# Patient Record
Sex: Male | Born: 1935 | Race: White | Hispanic: No | Marital: Married | Smoking: Never smoker
Health system: Southern US, Community
[De-identification: ages and names within clinical notes are randomized; demographics above are authoritative.]

## PROBLEM LIST (undated history)

## (undated) DIAGNOSIS — I82409 Acute embolism and thrombosis of unspecified deep veins of unspecified lower extremity: Secondary | ICD-10-CM

## (undated) DIAGNOSIS — K219 Gastro-esophageal reflux disease without esophagitis: Secondary | ICD-10-CM

## (undated) DIAGNOSIS — Z972 Presence of dental prosthetic device (complete) (partial): Secondary | ICD-10-CM

## (undated) DIAGNOSIS — Z974 Presence of external hearing-aid: Secondary | ICD-10-CM

## (undated) HISTORY — PX: KNEE ARTHROSCOPY: SUR90

## (undated) HISTORY — PX: CHOLECYSTECTOMY: SHX55

## (undated) HISTORY — PX: APPENDECTOMY: SHX54

## (undated) HISTORY — PX: TOTAL HIP ARTHROPLASTY: SHX124

---

## 2004-08-05 HISTORY — PX: UPPER GI ENDOSCOPY: SHX6162

## 2004-08-05 HISTORY — PX: COLONOSCOPY: SHX174

## 2004-08-09 ENCOUNTER — Ambulatory Visit: Payer: Self-pay | Admitting: General Surgery

## 2005-01-10 ENCOUNTER — Ambulatory Visit: Payer: Self-pay | Admitting: General Surgery

## 2005-02-22 ENCOUNTER — Ambulatory Visit: Payer: Self-pay | Admitting: Internal Medicine

## 2005-04-01 ENCOUNTER — Ambulatory Visit: Payer: Self-pay | Admitting: General Surgery

## 2006-03-17 ENCOUNTER — Ambulatory Visit: Payer: Self-pay | Admitting: Urology

## 2008-04-27 ENCOUNTER — Ambulatory Visit: Payer: Self-pay | Admitting: Urology

## 2009-08-11 ENCOUNTER — Ambulatory Visit: Payer: Self-pay | Admitting: General Surgery

## 2010-07-05 ENCOUNTER — Ambulatory Visit: Payer: Self-pay

## 2010-07-23 ENCOUNTER — Ambulatory Visit: Payer: Self-pay | Admitting: Unknown Physician Specialty

## 2010-07-26 ENCOUNTER — Inpatient Hospital Stay: Payer: Self-pay | Admitting: Unknown Physician Specialty

## 2010-08-05 DIAGNOSIS — I82409 Acute embolism and thrombosis of unspecified deep veins of unspecified lower extremity: Secondary | ICD-10-CM

## 2010-08-05 HISTORY — DX: Acute embolism and thrombosis of unspecified deep veins of unspecified lower extremity: I82.409

## 2010-08-23 ENCOUNTER — Inpatient Hospital Stay: Payer: Self-pay | Admitting: Internal Medicine

## 2010-08-28 ENCOUNTER — Ambulatory Visit: Payer: Self-pay | Admitting: Oncology

## 2010-09-05 ENCOUNTER — Ambulatory Visit: Payer: Self-pay | Admitting: Oncology

## 2010-10-04 ENCOUNTER — Ambulatory Visit: Payer: Self-pay | Admitting: Oncology

## 2010-11-04 ENCOUNTER — Ambulatory Visit: Payer: Self-pay | Admitting: Oncology

## 2010-11-21 ENCOUNTER — Ambulatory Visit: Payer: Self-pay | Admitting: Surgery

## 2010-12-11 ENCOUNTER — Ambulatory Visit: Payer: Self-pay | Admitting: Surgery

## 2011-01-18 ENCOUNTER — Ambulatory Visit: Payer: Self-pay | Admitting: Unknown Physician Specialty

## 2011-02-05 ENCOUNTER — Ambulatory Visit: Payer: Self-pay | Admitting: Vascular Surgery

## 2011-03-01 ENCOUNTER — Ambulatory Visit: Payer: Self-pay | Admitting: Internal Medicine

## 2011-03-06 ENCOUNTER — Ambulatory Visit: Payer: Self-pay | Admitting: Internal Medicine

## 2011-03-28 ENCOUNTER — Ambulatory Visit: Payer: Self-pay | Admitting: Vascular Surgery

## 2011-04-01 ENCOUNTER — Ambulatory Visit: Payer: Self-pay | Admitting: Unknown Physician Specialty

## 2011-04-02 ENCOUNTER — Inpatient Hospital Stay: Payer: Self-pay | Admitting: Unknown Physician Specialty

## 2011-04-06 ENCOUNTER — Ambulatory Visit: Payer: Self-pay | Admitting: Internal Medicine

## 2011-04-10 ENCOUNTER — Ambulatory Visit: Payer: Self-pay | Admitting: Internal Medicine

## 2011-10-02 ENCOUNTER — Ambulatory Visit: Payer: Self-pay | Admitting: Vascular Surgery

## 2011-12-23 IMAGING — NM NM MULTISYS EXAM
3 series · 8 of 14 positions shown, 9 images · non-contrast
Comparison: none

REASON FOR EXAM: left hip pain status post hip replacement
COMMENTS:

[Series 4: 3d bone 1.25 b70s · axial · 0.98mm/px · z∈[+1078,+1274]mm · 3 of 562 slices shown, 4 images (1 of 3)]
[im 141/562  soft-tissue]
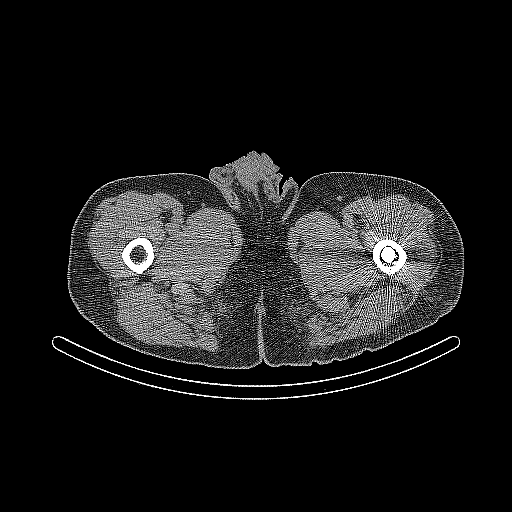
[im 141/562  bone]
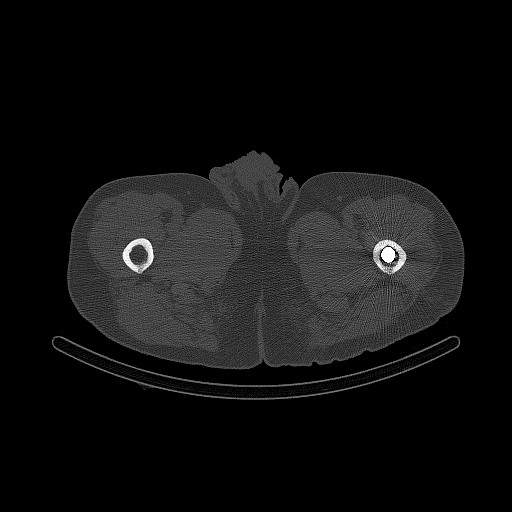
[im 281/562  bone]
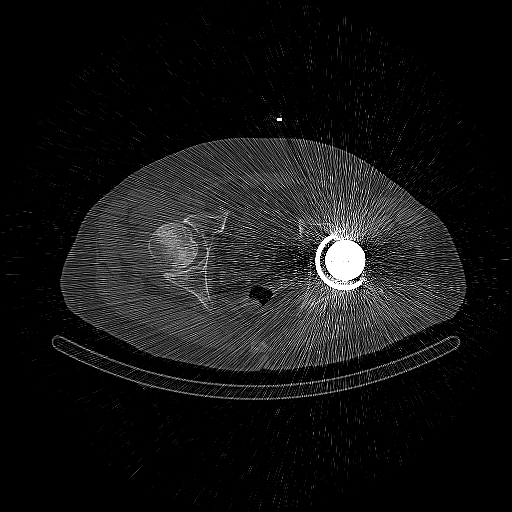
[im 421/562  bone]
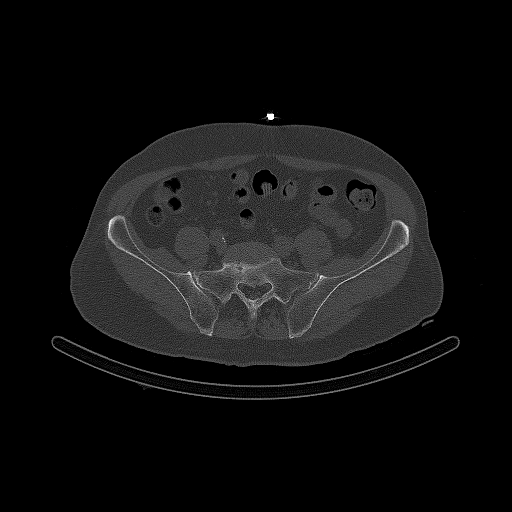

[Series 8: 3d bone 1.25 b70s · axial · 0.98mm/px · z∈[+1012,+1133]mm · 2 of 522 slices shown (2 of 3)]
[im 174/522  bone]
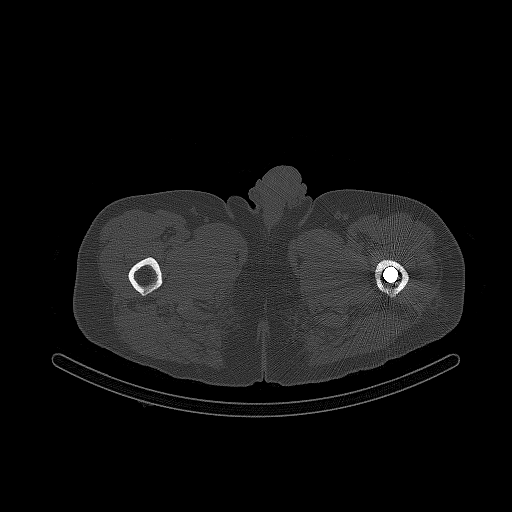
[im 348/522  bone]
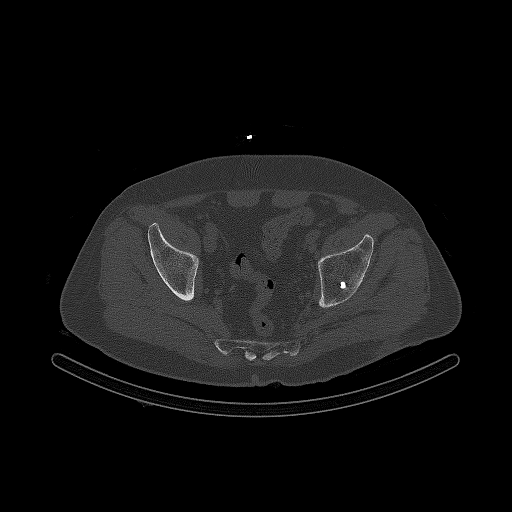

[Series 12: 3d bone 1.25 b70s · axial · 0.98mm/px · z∈[+1053,+1249]mm · 3 of 561 slices shown (3 of 3)]
[im 141/561  bone]
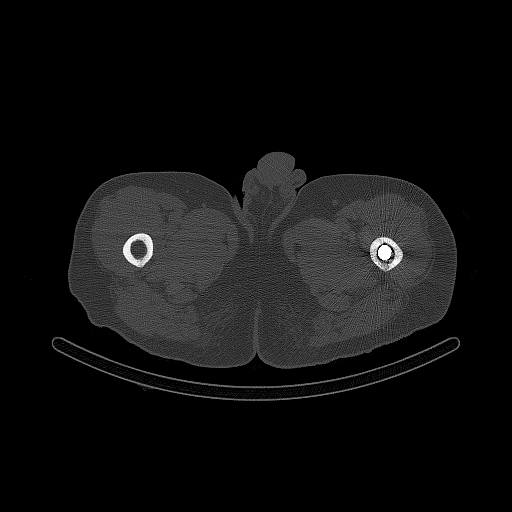
[im 281/561  bone]
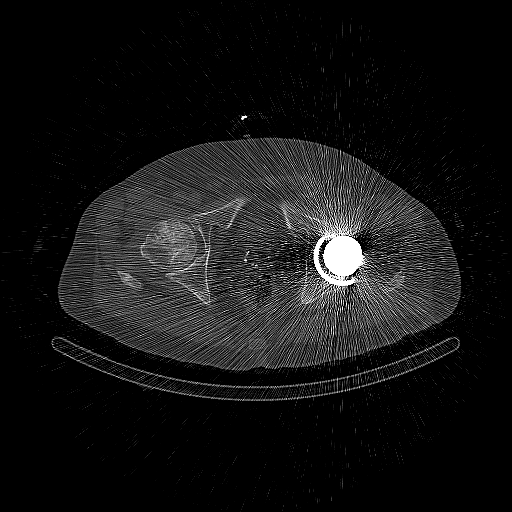
[im 421/561  bone]
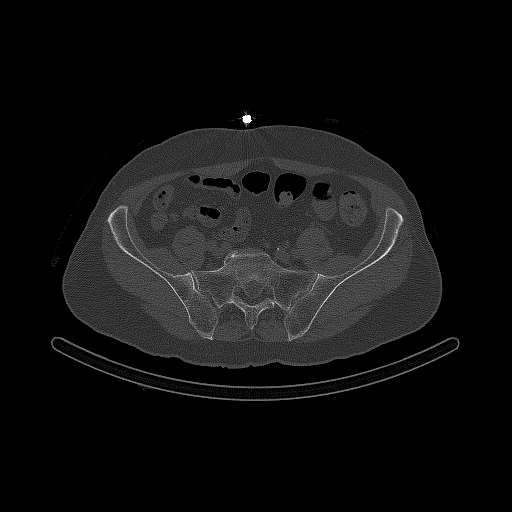

[8 of 14 positions shown; findings below may reference images not displayed]

PROCEDURE:     NM  - NM  IN - 111 WBC  LTD 48 HR   [DATE]  [DATE]

RESULT:     The patient underwent indium-888 imaging to evaluate the left
hip. Autologous labeled blood cells were injected. 395 uCi of indium-888 was
used to label the white blood cells. SPECT CT acquisition is performed. The
study shows no abnormal localization to suggest infection within the left
hip prosthesis. There is a somewhat mottled pattern of localization rather
than a fairly homogeneous pattern in the marrow on the delayed images which
could just be from decay of the tracer or binding. No persistent
reproducible abnormal tracer localization is seen within the soft tissue or
bony tissues on the 4-hour, 24-hour and 48-hour images. 72-hour image
acquisition was not performed because of limited tracer activity and the
poor quality of images at 48-hours.
IMPRESSION: No definite evidence of abnormal accumulation of labeled
white cells to suggest active infection.

## 2012-03-20 ENCOUNTER — Ambulatory Visit: Payer: Self-pay | Admitting: Internal Medicine

## 2012-03-20 LAB — CREATININE, SERUM
EGFR (African American): >60
EGFR (Non-African Amer.): >60

## 2013-01-25 DIAGNOSIS — N401 Enlarged prostate with lower urinary tract symptoms: Secondary | ICD-10-CM | POA: Insufficient documentation

## 2014-03-15 DIAGNOSIS — S83229A Peripheral tear of medial meniscus, current injury, unspecified knee, initial encounter: Secondary | ICD-10-CM | POA: Insufficient documentation

## 2014-03-21 ENCOUNTER — Ambulatory Visit: Payer: Self-pay | Admitting: General Practice

## 2014-05-18 ENCOUNTER — Ambulatory Visit: Payer: Self-pay | Admitting: General Practice

## 2014-05-24 ENCOUNTER — Ambulatory Visit: Payer: Self-pay | Admitting: Vascular Surgery

## 2014-05-25 ENCOUNTER — Ambulatory Visit: Payer: Self-pay | Admitting: General Practice

## 2014-10-04 ENCOUNTER — Ambulatory Visit: Payer: Self-pay | Admitting: Vascular Surgery

## 2014-11-14 ENCOUNTER — Ambulatory Visit: Payer: Self-pay | Admitting: Podiatry

## 2014-11-21 ENCOUNTER — Encounter: Payer: Self-pay | Admitting: Podiatry

## 2014-11-21 ENCOUNTER — Ambulatory Visit (INDEPENDENT_AMBULATORY_CARE_PROVIDER_SITE_OTHER): Payer: Medicare Other | Admitting: Podiatry

## 2014-11-21 VITALS — BP 128/73 | HR 70 | Resp 16 | Ht 69.0 in | Wt 174.0 lb

## 2014-11-21 DIAGNOSIS — M79673 Pain in unspecified foot: Secondary | ICD-10-CM

## 2014-11-21 DIAGNOSIS — B351 Tinea unguium: Secondary | ICD-10-CM | POA: Diagnosis not present

## 2014-11-21 NOTE — Progress Notes (Signed)
   Subjective:    Patient ID: Bill Cooper, male    DOB: 06/13/1930, 81 y.o.   MRN: [id number] Pt comes in today and wants to est care,  He would like his toe nails trimmed and cared for.  He would also like his toe nails looked at , he thinks that there is nail fungus on them .   HPI    Review of Systems  HENT: Positive for hearing loss.   All other systems reviewed and are negative.      Objective:   Physical ExamPresents today chief complaint of painful elongated toenails.  Objective: Pulses are palpable bilateral nails are thick, yellow dystrophic onychomycosis and painful palpation.         Assessment & Plan:  Assessment: Onychomycosis with pain in limb.  Plan: Treatment of nails in thickness and length as covered service secondary to pain.

## 2014-11-26 NOTE — Op Note (Signed)
PATIENT NAME:  Dallie DadHOYLE, Hamsa H MR#:  [id number] DATE OF BIRTH:  [birth year]  DATE OF PROCEDURE:  05/25/2014  PREOPERATIVE DIAGNOSIS: Internal derangement of the right knee.   POSTOPERATIVE DIAGNOSES: 1.  Tear of the posterior horn medial meniscus, right knee.  2.  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   PROCEDURE PERFORMED: Right knee arthroscopy, partial medial meniscectomy, and medial chondroplasty.   SURGEON: Illene LabradorJames P. Angie FavaHooten Jr., MD.    ANESTHESIA: General.   ESTIMATED BLOOD LOSS: Minimal.   TOURNIQUET TIME: Not used.   FLUIDS REPLACED: 700 mL of crystalloid.   INDICATIONS FOR SURGERY: The patient is an 81 year old male who has been seen for complaints of persistent right knee pain. MRI demonstrated findings consistent with meniscal pathology. After discussion of the risks and benefits of surgical intervention, the patient expressed understanding of the risks and benefits and agreed with plans for surgical intervention.   PROCEDURE IN DETAIL: The patient was brought into the Operating Room and, after adequate general anesthesia was achieved, a tourniquet was placed on the patient's right thigh and leg was placed in a leg holder. All bony prominences were well padded. The patient's right knee and leg were cleaned and prepped with alcohol and DuraPrep and draped in the usual sterile fashion. A "timeout" was performed as per usual protocol. The anticipated portal sites were injected with 0.25% Marcaine with epinephrine.   An anterolateral portal was created and a cannula was inserted. The scope was distended with fluid and the scope advanced down the medial gutter and into the medial compartment of the knee. Under visualization with the scope, an anteromedial portal was created and a hook probe was inserted. Inspection of the medial compartment demonstrated a complex degenerative tear of the posterior horn of the medial meniscus. This was freed using meniscal punches and a  4.5 mm shaver. Final contouring was performed using a 50-degree ArthroCare wand. The remaining rim of the meniscus was visualized and probed and felt to be stable. The anterior horn was visualized and probed and also felt to be stable. Inspection of the articular cartilage demonstrated grade 3 changes of chondromalacia primarily involving the medial femoral condyle. These areas were debrided and contoured using the 50-degree ArthroCare wand. The scope was advanced into the intracondylar region. The anterior cruciate ligament was visualized and probed and felt to be stable. The scope was removed from the anterolateral portal and reinserted via the anteromedial portal so as to better visualize the lateral compartment. The articular surface was in good condition. The lateral meniscus was visualized and probed and felt to be stable. Finally, the scope was positioned so as to visualize the patellofemoral articulation. Some mild chondral changes were noted. Good patellar tracking was noted. A chondroplasty was performed using the 50-degree ArthroCare wand.   The knee was irrigated with copious amounts of fluid and then suctioned dry. The anterolateral portal was reapproximated using #3-0 nylon. A combination of 0.25% Marcaine with epinephrine and 4 mg of morphine was injected via the scope. The scope was removed and the anteromedial portal was reapproximated using #3-0 nylon. A sterile dressing was applied followed by application of an ice wrap.   The patient tolerated the procedure well. He was transported to the recovery room in stable condition.    ____________________________ Illene LabradorJames P. Angie FavaHooten Jr., MD jph:at D: 05/25/2014 23:33:30 ET T: 05/26/2014 13:23:03 ET JOB#: [id number]  cc: Fayrene FearingJames P. Angie FavaHooten Jr., MD, <Dictator> Illene LabradorJAMES P Angie FavaHOOTEN JR MD ELECTRONICALLY SIGNED 05/26/2014 19:52

## 2014-11-26 NOTE — Op Note (Signed)
PATIENT NAME:  Bill Cooper, Bill Cooper MR#:  [id number] DATE OF BIRTH:  1930-05-04  DATE OF PROCEDURE:  05/24/2014  PREOPERATIVE DIAGNOSES:  1.  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  2.  Degenerative changes of the knee requiring surgery.   POSTOPERATIVE DIAGNOSES:  1.  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  2.  Degenerative changes of the knee requiring surgery.   PROCEDURES PERFORMED: 1.  Inferior venacavogram.  2.  Placement of infrarenal inferior vena caval filter.   PROCEDURE PERFORMED BY:  Renford DillsGregory G. Ailea Rhatigan, MD.   SEDATION:  Versed 3 mg plus fentanyl 100 mcg administered IV. Continuous ECG, pulse oximetry and cardiopulmonary monitoring were performed throughout the entire procedure by the interventional radiology nurse. Total sedation time was 30 minutes.   ACCESS: A 9 French sheath, right common femoral artery.   FLUOROSCOPY TIME: 0.7 minute.   CONTRAST USED: Isovue 15 mL.   INDICATIONS: [name] is an 81 year old gentleman who has had multiple episodes of deep venous thrombosis, all of which were following orthopedic surgery. He has required bilateral hip replacements as well as revision of the initial hip replacement. At this time now he needs knee surgery and has been sent for evaluation for IVC filter. Risks and benefits were reviewed. All questions answered. Clearly, given the risk of lethal pulmonary embolism and his repetitive history in the past the filter should be placed.  Again, removal was discussed with the patient and he is in agreement with this.   DESCRIPTION OF PROCEDURE: The patient is taken to special procedures and placed in supine position. After adequate sedation is achieved the right groin is prepped and draped in sterile fashion. Ultrasound is placed in a sterile sleeve. Common femoral artery is identified. It is echolucent and compressible indicating  patency. Image is recorded for the permanent record.   Under real-time visualization Seldinger needle is inserted, J-wire is advanced. A small incision is made in the skin and the dilator-sheath combination is passed over the wire. The sheath is then positioned with the marker dilator in the sheath at the confluence of the iliac veins. Bolus injection of contrast is then given and the vena cava is evaluated. After review of the images, the wire is reintroduced. The catheter is advanced up to the level of L2, and subsequently the Bard filter is deployed. This is the [place] filter.   The sheath is pulled, pressure is held, and there are no immediate complications.   INTERPRETATION: Inferior vena cava is opacified with a bolus injection of contrast. There is renal blush at L1. At L2 the cava measures approximately 18 mm in diameter. This is well within the parameters for the filter.   Filter is placed with excellent orientation in a vertical positioning.  SUMMARY: Successful placement of infrarenal inferior vena cava filter as described above.    ____________________________ Renford DillsGregory G. Davyn Elsasser, MD ggs:lt D: 05/25/2014 16:44:00 ET T: 05/26/2014 07:55:43 ET JOB#: [id number]  cc: Renford DillsGregory G. Nandika Stetzer, MD, <Dictator> Renford DillsGREGORY G Beverlie Kurihara MD ELECTRONICALLY SIGNED 06/08/2014 11:44

## 2014-12-04 NOTE — Op Note (Signed)
PATIENT NAME:  Dallie DadHOYLE, Turner H MR#:  [id number] DATE OF BIRTH:  [birth year]  DATE OF PROCEDURE:  10/04/2014  PREOPERATIVE DIAGNOSIS:  History of deep vein thrombosis and PE.   POSTOPERATIVE DIAGNOSIS:  History of deep vein thrombosis and PE.  PROCEDURE PERFORMED: Removal of inferior vena cava filter.   PROCEDURE PERFORMED BY: Renford DillsGregory G Ailsa Mireles, M.D.   SEDATION: Versed plus fentanyl.   CONTRAST USED: Isovue 15 mL.   FLUOROSCOPY TIME: 1.7 minutes.   INDICATIONS: The patient is an 81 year old gentleman who presents for removal of this filter. Risks and benefits are reviewed. The patient has agreed to proceed.   DESCRIPTION OF PROCEDURE: The patient is taken to special procedures and placed in the supine position. After adequate sedation is achieved the right neck is prepped and draped in sterile fashion. Ultrasound is placed in a sterile sleeve. Jugular vein is identified. Echolucent and compressible indicating patency. Images recorded and under real-time visualization, Seldinger needle is inserted. J-wire is advanced under fluoroscopic guidance, followed by the dilator and retrieval sheath. The sheath is positioned at the iliac confluence and AP projection of the vena cava is obtained. The filter is free of thrombus. No filling defects are noted, and therefore, the sheath is repositioned, snare is deployed. The filter is captured, collapsed and then withdrawn without difficulty. Light pressure is held, and there are no immediate complications.   INTERPRETATION: Successful retrieval of IVC filter as described above.    ____________________________ Renford DillsGregory G. Rastus Borton, MD ggs:mc D: 10/04/2014 13:56:42 ET T: 10/04/2014 15:35:36 ET JOB#: [id number]  cc: Renford DillsGregory G. Leauna Sharber, MD, <Dictator> Renford DillsGREGORY G Cashius Grandstaff MD ELECTRONICALLY SIGNED 10/11/2014 14:27

## 2014-12-22 ENCOUNTER — Encounter: Payer: Self-pay | Admitting: *Deleted

## 2014-12-27 NOTE — Discharge Instructions (Signed)

## 2014-12-28 ENCOUNTER — Encounter
Admission: RE | Disposition: A | Discharge status: Home / Self Care | Payer: Self-pay | Source: Ambulatory Visit | Attending: Ophthalmology

## 2014-12-28 ENCOUNTER — Ambulatory Visit: Payer: Medicare Other | Admitting: Anesthesiology

## 2014-12-28 ENCOUNTER — Ambulatory Visit
Admission: RE | Admit: 2014-12-28 | Discharge: 2014-12-28 | Disposition: A | Discharge status: Home / Self Care | Payer: Medicare Other | Source: Ambulatory Visit | Attending: Ophthalmology | Admitting: Ophthalmology

## 2014-12-28 ENCOUNTER — Encounter: Payer: Self-pay | Admitting: *Deleted

## 2014-12-28 DIAGNOSIS — M1990 Unspecified osteoarthritis, unspecified site: Secondary | ICD-10-CM | POA: Insufficient documentation

## 2014-12-28 DIAGNOSIS — Z96649 Presence of unspecified artificial hip joint: Secondary | ICD-10-CM | POA: Insufficient documentation

## 2014-12-28 DIAGNOSIS — Z79899 Other long term (current) drug therapy: Secondary | ICD-10-CM | POA: Insufficient documentation

## 2014-12-28 DIAGNOSIS — K219 Gastro-esophageal reflux disease without esophagitis: Secondary | ICD-10-CM | POA: Diagnosis not present

## 2014-12-28 DIAGNOSIS — Z86718 Personal history of other venous thrombosis and embolism: Secondary | ICD-10-CM | POA: Insufficient documentation

## 2014-12-28 DIAGNOSIS — H2511 Age-related nuclear cataract, right eye: Secondary | ICD-10-CM | POA: Diagnosis not present

## 2014-12-28 DIAGNOSIS — H9190 Unspecified hearing loss, unspecified ear: Secondary | ICD-10-CM | POA: Insufficient documentation

## 2014-12-28 HISTORY — DX: Presence of dental prosthetic device (complete) (partial): Z97.2

## 2014-12-28 HISTORY — PX: CATARACT EXTRACTION W/PHACO: SHX586

## 2014-12-28 HISTORY — DX: Gastro-esophageal reflux disease without esophagitis: K21.9

## 2014-12-28 HISTORY — DX: Presence of external hearing-aid: Z97.4

## 2014-12-28 HISTORY — DX: Acute embolism and thrombosis of unspecified deep veins of unspecified lower extremity: I82.409

## 2014-12-28 SURGERY — PHACOEMULSIFICATION, CATARACT, WITH IOL INSERTION
Anesthesia: Monitor Anesthesia Care | Laterality: Right | Wound class: Clean

## 2014-12-28 MED ORDER — EPINEPHRINE HCL 1 MG/ML IJ SOLN
INTRAMUSCULAR | Status: DC | PRN
  Administered 2014-12-28: 1 mg

## 2014-12-28 MED ORDER — ARMC OPHTHALMIC DILATING GEL
1.0000 "application " | OPHTHALMIC | Status: DC | PRN
  Administered 2014-12-28 (×2): 1 via OPHTHALMIC

## 2014-12-28 MED ORDER — FENTANYL CITRATE (PF) 100 MCG/2ML IJ SOLN
INTRAMUSCULAR | Status: DC | PRN
  Administered 2014-12-28: 50 ug via INTRAVENOUS

## 2014-12-28 MED ORDER — NA HYALUR & NA CHOND-NA HYALUR 0.4-0.35 ML IO KIT
PACK | INTRAOCULAR | Status: DC | PRN
  Administered 2014-12-28: 1 mL via INTRAOCULAR

## 2014-12-28 MED ORDER — CEFUROXIME OPHTHALMIC INJECTION 1 MG/0.1 ML
INJECTION | OPHTHALMIC | Status: DC | PRN
  Administered 2014-12-28: 0.1 mL via INTRACAMERAL

## 2014-12-28 MED ORDER — ACETAMINOPHEN 325 MG PO TABS: 325.0000 mg | ORAL_TABLET | ORAL | Status: DC | PRN

## 2014-12-28 MED ORDER — MIDAZOLAM HCL 2 MG/2ML IJ SOLN
INTRAMUSCULAR | Status: DC | PRN
  Administered 2014-12-28: 2 mg via INTRAVENOUS

## 2014-12-28 MED ORDER — ACETAMINOPHEN 160 MG/5ML PO SOLN: 325.0000 mg | ORAL | Status: DC | PRN

## 2014-12-28 MED ORDER — TETRACAINE HCL 0.5 % OP SOLN
1.0000 [drp] | OPHTHALMIC | Status: DC | PRN
  Administered 2014-12-28: 1 [drp] via OPHTHALMIC

## 2014-12-28 MED ORDER — TIMOLOL MALEATE 0.5 % OP SOLN
OPHTHALMIC | Status: DC | PRN
  Administered 2014-12-28: 1 [drp] via OPHTHALMIC

## 2014-12-28 MED ORDER — BRIMONIDINE TARTRATE 0.2 % OP SOLN
OPHTHALMIC | Status: DC | PRN
  Administered 2014-12-28: 1 [drp] via OPHTHALMIC

## 2014-12-28 MED ORDER — POVIDONE-IODINE 5 % OP SOLN
1.0000 "application " | Freq: Once | OPHTHALMIC | Status: AC
  Administered 2014-12-28: 1 via OPHTHALMIC

## 2014-12-28 SURGICAL SUPPLY — 25 items
CANNULA ANT/CHMB 27GA (MISCELLANEOUS) ×3 IMPLANT
GLOVE SURG LX 7.5 STRW (GLOVE) ×2
GLOVE SURG LX STRL 7.5 STRW (GLOVE) ×1 IMPLANT
GLOVE SURG TRIUMPH 8.0 PF LTX (GLOVE) ×3 IMPLANT
GOWN STRL REUS W/ TWL LRG LVL3 (GOWN DISPOSABLE) ×2 IMPLANT
GOWN STRL REUS W/TWL LRG LVL3 (GOWN DISPOSABLE) ×4
LENS IOL TECNIS 16.0 (Intraocular Lens) ×3 IMPLANT
LENS IOL TECNIS MONO 1P 16.0 (Intraocular Lens) ×1 IMPLANT
MARKER SKIN SURG W/RULER VIO (MISCELLANEOUS) ×3 IMPLANT
NDL RETROBULBAR .5 NSTRL (NEEDLE) IMPLANT
NEEDLE FILTER BLUNT 18X 1/2SAF (NEEDLE) ×2
NEEDLE FILTER BLUNT 18X1 1/2 (NEEDLE) ×1 IMPLANT
PACK CATARACT BRASINGTON (MISCELLANEOUS) ×3 IMPLANT
PACK EYE AFTER SURG (MISCELLANEOUS) ×3 IMPLANT
PACK OPTHALMIC (MISCELLANEOUS) ×3 IMPLANT
RING MALYGIN 7.0 (MISCELLANEOUS) IMPLANT
SUT ETHILON 10-0 CS-B-6CS-B-6 (SUTURE)
SUT VICRYL  9 0 (SUTURE)
SUT VICRYL 9 0 (SUTURE) IMPLANT
SUTURE EHLN 10-0 CS-B-6CS-B-6 (SUTURE) IMPLANT
SYR 3ML LL SCALE MARK (SYRINGE) ×3 IMPLANT
SYR 5ML LL (SYRINGE) IMPLANT
SYR TB 1ML LUER SLIP (SYRINGE) ×3 IMPLANT
WATER STERILE IRR 500ML POUR (IV SOLUTION) ×3 IMPLANT
WIPE NON LINTING 3.25X3.25 (MISCELLANEOUS) ×3 IMPLANT

## 2014-12-28 NOTE — H&P (Signed)
  The History and Physical notes were scanned in.  The patient remains stable and unchanged from the H&P.   Previous H&P reviewed, patient examined, and there are no changes.  Bill Cooper 12/28/2014 8:46 AM  

## 2014-12-28 NOTE — Transfer of Care (Signed)
Immediate Anesthesia Transfer of Care Note  Patient: Bill Cooper  Procedure(s) Performed: Procedure(s): CATARACT EXTRACTION PHACO AND INTRAOCULAR LENS PLACEMENT (IOC) (Right)  Patient Location: PACU  Anesthesia Type: MAC  Level of Consciousness: awake, alert  and patient cooperative  Airway and Oxygen Therapy: Patient Spontanous Breathing and Patient connected to supplemental oxygen  Post-op Assessment: Post-op Vital signs reviewed, Patient's Cardiovascular Status Stable, Respiratory Function Stable, Patent Airway and No signs of Nausea or vomiting  Post-op Vital Signs: Reviewed and stable  Complications: No apparent anesthesia complications

## 2014-12-28 NOTE — Anesthesia Postprocedure Evaluation (Signed)
  Anesthesia Post-op Note  Patient: Bill Cooper  Procedure(s) Performed: Procedure(s): CATARACT EXTRACTION PHACO AND INTRAOCULAR LENS PLACEMENT (IOC) (Right)  Anesthesia type:MAC  Patient location: PACU  Post pain: Pain level controlled  Post assessment: Post-op Vital signs reviewed, Patient's Cardiovascular Status Stable, Respiratory Function Stable, Patent Airway and No signs of Nausea or vomiting  Post vital signs: Reviewed and stable  Last Vitals:  Filed Vitals:   12/28/14 0945  BP: 110/65  Pulse: 63  Temp: 36.5 C  Resp: 16    Level of consciousness: awake, alert  and patient cooperative  Complications: No apparent anesthesia complications

## 2014-12-28 NOTE — Op Note (Signed)
LOCATION:  [facility]   PREOPERATIVE DIAGNOSIS:    Nuclear sclerotic cataract right eye. H25.11   POSTOPERATIVE DIAGNOSIS:  Nuclear sclerotic cataract right eye.     PROCEDURE:  Phacoemusification with posterior chamber intraocular lens placement of the right eye   LENS:   Implant Name Type Inv. Item Serial No. Manufacturer Lot No. LRB No. Used  LENS IMPL INTRAOC [id number] 16.0 - [id number] Intraocular Lens LENS IMPL INTRAOC [id number] 16.0 [id number]   Right 1        ULTRASOUND TIME: 14 % of 1 minutes, 24 seconds.  CDE 11.7   SURGEON:  Deirdre Evenerhadwick R. Haylei Cobin, MD   ANESTHESIA:  Topical with tetracaine drops and 2% Xylocaine jelly.   COMPLICATIONS:  None.   DESCRIPTION OF PROCEDURE:  The patient was identified in the holding room and transported to the operating room and placed in the supine position under the operating microscope.  The right eye was identified as the operative eye and it was prepped and draped in the usual sterile ophthalmic fashion.   A 1 millimeter clear-corneal paracentesis was made at the 12:00 position.  The anterior chamber was filled with Viscoat viscoelastic.  A 2.4 millimeter keratome was used to make a near-clear corneal incision at the 9:00 position.  A curvilinear capsulorrhexis was made with a cystotome and capsulorrhexis forceps.  Balanced salt solution was used to hydrodissect and hydrodelineate the nucleus.   Phacoemulsification was then used in stop and chop fashion to remove the lens nucleus and epinucleus.  The remaining cortex was then removed using the irrigation and aspiration handpiece. Provisc was then placed into the capsular bag to distend it for lens placement.  A lens was then injected into the capsular bag.  The remaining viscoelastic was aspirated.   Wounds were hydrated with balanced salt solution.  The anterior chamber was inflated to a physiologic pressure with balanced salt solution.  No wound leaks were noted. Cefuroxime 0.1 ml of a  10mg /ml solution was injected into the anterior chamber for a dose of 1 mg of intracameral antibiotic at the completion of the case.   Timolol and Brimonidine drops were applied to the eye.  The patient was taken to the recovery room in stable condition without complications of anesthesia or surgery.   Marium Ragan 12/28/2014, 9:44 AM

## 2014-12-28 NOTE — Anesthesia Preprocedure Evaluation (Signed)
Anesthesia Evaluation  Patient identified by MRN, date of birth, ID band Patient awake    Reviewed: Allergy & Precautions, H&P , NPO status , Patient's Chart, lab work & pertinent test results  Airway Mallampati: II  TM Distance: >3 FB Neck ROM: full    Dental  (+) Upper Dentures, Partial Lower   Pulmonary neg pulmonary ROS,    Pulmonary exam normal       Cardiovascular negative cardio ROS Normal cardiovascular exam    Neuro/Psych    GI/Hepatic Neg liver ROS, GERD-  Medicated,  Endo/Other  negative endocrine ROS  Renal/GU negative Renal ROS     Musculoskeletal   Abdominal   Peds  Hematology negative hematology ROS (+)   Anesthesia Other Findings   Reproductive/Obstetrics negative OB ROS                             Anesthesia Physical Anesthesia Plan  ASA: I  Anesthesia Plan: MAC   Post-op Pain Management:    Induction:   Airway Management Planned:   Additional Equipment:   Intra-op Plan:   Post-operative Plan:   Informed Consent: I have reviewed the patients History and Physical, chart, labs and discussed the procedure including the risks, benefits and alternatives for the proposed anesthesia with the patient or authorized representative who has indicated his/her understanding and acceptance.     Plan Discussed with: CRNA  Anesthesia Plan Comments:         Anesthesia Quick Evaluation

## 2014-12-29 ENCOUNTER — Encounter: Payer: Self-pay | Admitting: Ophthalmology

## 2015-10-11 DIAGNOSIS — Z86718 Personal history of other venous thrombosis and embolism: Secondary | ICD-10-CM | POA: Insufficient documentation

## 2016-05-06 ENCOUNTER — Emergency Department: Payer: Medicare Other

## 2016-05-06 ENCOUNTER — Inpatient Hospital Stay
Admission: EM | Admit: 2016-05-06 | Discharge: 2016-05-08 | DRG: 872 | Disposition: A | Discharge status: Home / Self Care | Payer: Medicare Other | Attending: Internal Medicine | Admitting: Internal Medicine

## 2016-05-06 ENCOUNTER — Encounter: Payer: Self-pay | Admitting: *Deleted

## 2016-05-06 DIAGNOSIS — E872 Acidosis: Secondary | ICD-10-CM | POA: Diagnosis present

## 2016-05-06 DIAGNOSIS — I959 Hypotension, unspecified: Secondary | ICD-10-CM | POA: Diagnosis present

## 2016-05-06 DIAGNOSIS — Z96643 Presence of artificial hip joint, bilateral: Secondary | ICD-10-CM | POA: Diagnosis present

## 2016-05-06 DIAGNOSIS — M6281 Muscle weakness (generalized): Secondary | ICD-10-CM

## 2016-05-06 DIAGNOSIS — K219 Gastro-esophageal reflux disease without esophagitis: Secondary | ICD-10-CM | POA: Diagnosis present

## 2016-05-06 DIAGNOSIS — B349 Viral infection, unspecified: Secondary | ICD-10-CM | POA: Diagnosis present

## 2016-05-06 DIAGNOSIS — R55 Syncope and collapse: Secondary | ICD-10-CM | POA: Diagnosis present

## 2016-05-06 DIAGNOSIS — R7301 Impaired fasting glucose: Secondary | ICD-10-CM | POA: Diagnosis present

## 2016-05-06 DIAGNOSIS — Z8249 Family history of ischemic heart disease and other diseases of the circulatory system: Secondary | ICD-10-CM

## 2016-05-06 DIAGNOSIS — E860 Dehydration: Secondary | ICD-10-CM | POA: Diagnosis present

## 2016-05-06 DIAGNOSIS — R509 Fever, unspecified: Secondary | ICD-10-CM | POA: Diagnosis present

## 2016-05-06 DIAGNOSIS — A419 Sepsis, unspecified organism: Secondary | ICD-10-CM | POA: Diagnosis present

## 2016-05-06 DIAGNOSIS — R197 Diarrhea, unspecified: Secondary | ICD-10-CM

## 2016-05-06 LAB — URINALYSIS COMPLETE WITH MICROSCOPIC (ARMC ONLY)
BACTERIA UA: NONE SEEN
Bilirubin Urine: NEGATIVE
GLUCOSE, UA: NEGATIVE mg/dL
HGB URINE DIPSTICK: NEGATIVE
NITRITE: NEGATIVE
Protein, ur: NEGATIVE mg/dL
SPECIFIC GRAVITY, URINE: 1.014 (ref 1.005–1.030)
Squamous Epithelial / LPF: NONE SEEN
pH: 5 (ref 5.0–8.0)

## 2016-05-06 LAB — COMPREHENSIVE METABOLIC PANEL
ALBUMIN: 3.8 g/dL (ref 3.5–5.0)
ALK PHOS: 72 U/L (ref 38–126)
ALT: 14 U/L — ABNORMAL LOW (ref 17–63)
ANION GAP: 10 (ref 5–15)
AST: 28 U/L (ref 15–41)
BUN: 14 mg/dL (ref 6–20)
CALCIUM: 9 mg/dL (ref 8.9–10.3)
CO2: 22 mmol/L (ref 22–32)
Chloride: 104 mmol/L (ref 101–111)
Creatinine, Ser: 0.95 mg/dL (ref 0.61–1.24)
GFR calc non Af Amer: >60 mL/min (ref >60)
GLUCOSE: 142 mg/dL — AB (ref 65–99)
POTASSIUM: 3.7 mmol/L (ref 3.5–5.1)
SODIUM: 136 mmol/L (ref 135–145)
TOTAL PROTEIN: 7 g/dL (ref 6.5–8.1)
Total Bilirubin: 1.3 mg/dL — ABNORMAL HIGH (ref 0.3–1.2)

## 2016-05-06 LAB — LACTIC ACID, PLASMA
LACTIC ACID, VENOUS: 1.5 mmol/L (ref 0.5–1.9)
Lactic Acid, Venous: 2.5 mmol/L (ref 0.5–1.9)
Lactic Acid, Venous: 1.2 mmol/L (ref 0.5–1.9)

## 2016-05-06 LAB — CBC WITH DIFFERENTIAL/PLATELET
BASOS PCT: 0 %
Basophils Absolute: 0 10*3/uL (ref 0–0.1)
EOS ABS: 0 10*3/uL (ref 0–0.7)
EOS PCT: 0 %
HCT: 43 % (ref 40.0–52.0)
Hemoglobin: 14.4 g/dL (ref 13.0–18.0)
LYMPHS ABS: 0.8 10*3/uL — AB (ref 1.0–3.6)
LYMPHS PCT: 7 %
MCH: 30.6 pg (ref 26.0–34.0)
MCHC: 33.5 g/dL (ref 32.0–36.0)
MCV: 91.4 fL (ref 80.0–100.0)
MONO ABS: 1 10*3/uL (ref 0.2–1.0)
MONOS PCT: 8 %
Neutro Abs: 10.7 10*3/uL — ABNORMAL HIGH (ref 1.4–6.5)
Neutrophils Relative %: 85 %
Platelets: 193 10*3/uL (ref 150–440)
RBC: 4.71 MIL/uL (ref 4.40–5.90)
RDW: 13.5 % (ref 11.5–14.5)
WBC: 12.7 10*3/uL — ABNORMAL HIGH (ref 3.8–10.6)

## 2016-05-06 LAB — TROPONIN I: Troponin I: <0.03 ng/mL (ref <0.03)

## 2016-05-06 LAB — LIPASE, BLOOD: Lipase: 25 U/L (ref 11–51)

## 2016-05-06 LAB — PROCALCITONIN: Procalcitonin: 0.87 ng/mL

## 2016-05-06 MED ORDER — SODIUM CHLORIDE 0.9 % IV SOLN
INTRAVENOUS | Status: DC
  Administered 2016-05-06 – 2016-05-08 (×4): via INTRAVENOUS

## 2016-05-06 MED ORDER — CEFTRIAXONE SODIUM 2 G IJ SOLR
2.0000 g | INTRAMUSCULAR | Status: DC
Start: 2016-05-06 — End: 2016-05-07
  Administered 2016-05-06: 2 g via INTRAVENOUS
  Filled 2016-05-06 (×2): qty 2

## 2016-05-06 MED ORDER — VANCOMYCIN HCL IN DEXTROSE 1-5 GM/200ML-% IV SOLN
1000.0000 mg | Freq: Once | INTRAVENOUS | Status: AC
  Administered 2016-05-06: 1000 mg via INTRAVENOUS

## 2016-05-06 MED ORDER — SODIUM CHLORIDE 0.9 % IV BOLUS (SEPSIS)
1000.0000 mL | Freq: Once | INTRAVENOUS | Status: AC
  Administered 2016-05-06: 1000 mL via INTRAVENOUS

## 2016-05-06 MED ORDER — ADULT MULTIVITAMIN W/MINERALS CH
1.0000 | ORAL_TABLET | Freq: Every day | ORAL | Status: DC
  Administered 2016-05-07 – 2016-05-08 (×2): 1 via ORAL
  Filled 2016-05-06 (×2): qty 1

## 2016-05-06 MED ORDER — ENOXAPARIN SODIUM 40 MG/0.4ML ~~LOC~~ SOLN
40.0000 mg | SUBCUTANEOUS | Status: DC
  Administered 2016-05-06 – 2016-05-07 (×2): 40 mg via SUBCUTANEOUS
  Filled 2016-05-06 (×2): qty 0.4

## 2016-05-06 MED ORDER — SODIUM CHLORIDE 0.9 % IV BOLUS (SEPSIS)
1328.0000 mL | Freq: Once | INTRAVENOUS | Status: AC
  Administered 2016-05-06: 1328 mL via INTRAVENOUS

## 2016-05-06 MED ORDER — VANCOMYCIN HCL IN DEXTROSE 1-5 GM/200ML-% IV SOLN
INTRAVENOUS | Status: AC
  Administered 2016-05-06: 1000 mg via INTRAVENOUS
  Filled 2016-05-06: qty 200

## 2016-05-06 MED ORDER — PIPERACILLIN-TAZOBACTAM 3.375 G IVPB 30 MIN
3.3750 g | Freq: Once | INTRAVENOUS | Status: AC
  Administered 2016-05-06: 3.375 g via INTRAVENOUS

## 2016-05-06 MED ORDER — PANTOPRAZOLE SODIUM 40 MG PO TBEC: 40.0000 mg | DELAYED_RELEASE_TABLET | Freq: Every day | ORAL | Status: DC | PRN

## 2016-05-06 MED ORDER — DOXYCYCLINE HYCLATE 100 MG PO TABS
100.0000 mg | ORAL_TABLET | Freq: Two times a day (BID) | ORAL | Status: DC
  Administered 2016-05-06 – 2016-05-08 (×4): 100 mg via ORAL
  Filled 2016-05-06 (×4): qty 1

## 2016-05-06 MED ORDER — PIPERACILLIN-TAZOBACTAM 3.375 G IVPB
INTRAVENOUS | Status: AC
  Administered 2016-05-06: 3.375 g via INTRAVENOUS
  Filled 2016-05-06: qty 50

## 2016-05-06 NOTE — Progress Notes (Signed)
At start of shift notified Charge RN that pt requires telemetry and no boxes available on unit.  Unit secretary calling other units for box but none available at this time.  Henriette CombsSarah Kordae Buonocore RN

## 2016-05-06 NOTE — ED Triage Notes (Signed)
Pt has had diarrhea for 2 days, pt temp 102 at home today, [facility], pt had syncopal episode on toilet after bowel movement, [facility], pt pale, alert and oriented

## 2016-05-06 NOTE — H&P (Signed)
[facility] - [place] at [facility]   PATIENT NAME: Bill Cooper    MR#:  [id number]  DATE OF BIRTH:  10/23/1929  DATE OF ADMISSION:  05/06/2016  PRIMARY CARE PHYSICIAN: Lynnea FerrierBERT J KLEIN III, MD   REQUESTING/REFERRING PHYSICIAN: Dr Raymon MuttonErika Gayle  CHIEF COMPLAINT:   Chief Complaint  Patient presents with  . Diarrhea  . Loss of Consciousness    HISTORY OF PRESENT ILLNESS:  Bill BrickBeacham Wiesman  is a 82 y.o. male and presents to the ER with a fever of 101.5 at home. He had a Riger this morning with shaking all over. They called Dr. Odessa FlemingKlein's office and was seen for an appointment. When they sent him over to get laboratory data he passed out. The patient complains of diarrhea for the past 3 or 4 mornings. He has diarrhea twice a day. No blood in the bowel movements. He's been feeling really weak. One month ago he had teeth cleaning but he had his prophylactic antibiotics prior. He felt weak a few weeks ago. The patient states he is out in the woods quite a bit. They picked off ticks about a month ago. Slight cough but no production.  PAST MEDICAL HISTORY:   Past Medical History:  Diagnosis Date  . DVT (deep venous thrombosis) (HCC) 2012   after hip replacement  . GERD (gastroesophageal reflux disease)    rare  . Wears dentures    full upper, partial lower  . Wears hearing aid    bilateral    PAST SURGICAL HISTORY:   Past Surgical History:  Procedure Laterality Date  . APPENDECTOMY    . CATARACT EXTRACTION W/PHACO Right 12/28/2014   Procedure: CATARACT EXTRACTION PHACO AND INTRAOCULAR LENS PLACEMENT (IOC);  Surgeon: Lockie Molahadwick Brasington, MD;  [facility];  Service: Ophthalmology;  Laterality: Right;  . CHOLECYSTECTOMY    . COLONOSCOPY    . KNEE ARTHROSCOPY    . TOTAL HIP ARTHROPLASTY Bilateral     SOCIAL HISTORY:   Social History  Substance Use Topics  . Smoking status: Never Smoker  . Smokeless tobacco: Never Used  . Alcohol use No     FAMILY HISTORY:   Family History  Problem Relation Age of Onset  . CAD Father     DRUG ALLERGIES:  No Known Allergies  REVIEW OF SYSTEMS:  CONSTITUTIONAL: Positive for fever, chills and sweats and Rigors. Positive for weakness EYES: No blurred or double vision. Wears glasses EARS, NOSE, AND THROAT: No tinnitus or ear pain. No sore throat. Wears hearing aid RESPIRATORY: Slight cough, no shortness of breath, wheezing or hemoptysis.  CARDIOVASCULAR: No chest pain, orthopnea, edema.  GASTROINTESTINAL: No nausea, vomiting, diarrhea or abdominal pain. No blood in bowel movements GENITOURINARY: No dysuria, hematuria.  ENDOCRINE: No polyuria, nocturia,  HEMATOLOGY: No anemia, easy bruising or bleeding SKIN: No rash or lesion. MUSCULOSKELETAL: Arthritis pain.  NEUROLOGIC: Syncope today PSYCHIATRY: No anxiety or depression.   MEDICATIONS AT HOME:   Prior to Admission medications   Medication Sig Start Date End Date Taking? Authorizing Provider  Multiple Vitamin (MULTIVITAMIN) capsule Take 1 capsule by mouth daily.   Yes Historical Provider, MD  omeprazole (PRILOSEC) 20 MG capsule Take 20 mg by mouth as needed.   Yes Historical Provider, MD      VITAL SIGNS:  Blood pressure (!) 116/54, pulse 75, temperature 98.8 F (37.1 C), temperature source Oral, resp. rate 20, height 5\' 8"  (1.727 m), weight 77.6 kg (171 lb), SpO2 95 %.  PHYSICAL EXAMINATION:  GENERAL:  82 y.o.-year-old patient lying in the bed with no acute distress.  EYES: Pupils equal, round, reactive to light and accommodation. No scleral icterus. Extraocular muscles intact.  HEENT: Head atraumatic, normocephalic. Oropharynx and nasopharynx clear.  NECK:  Supple, no jugular venous distention. No thyroid enlargement, no tenderness.  LUNGS: Normal breath sounds bilaterally, no wheezing, rales,rhonchi or crepitation. No use of accessory muscles of respiration.  CARDIOVASCULAR: S1, S2 normal. No murmurs, rubs, or gallops.   ABDOMEN: Soft, nontender, nondistended. Bowel sounds present. No organomegaly or mass.  EXTREMITIES: No pedal edema, cyanosis, or clubbing.  NEUROLOGIC: Cranial nerves II through XII are intact. Muscle strength 5/5 in all extremities. Sensation intact. Gait not checked.  PSYCHIATRIC: The patient is alert and oriented x 3.  SKIN: No rash, lesion, or ulcer.   LABORATORY PANEL:   CBC  Recent Labs Lab 05/06/16 1302  WBC 12.7*  HGB 14.4  HCT 43.0  PLT 193   ------------------------------------------------------------------------------------------------------------------  Chemistries   Recent Labs Lab 05/06/16 1302  NA 136  K 3.7  CL 104  CO2 22  GLUCOSE 142*  BUN 14  CREATININE 0.95  CALCIUM 9.0  AST 28  ALT 14*  ALKPHOS 72  BILITOT 1.3*   ------------------------------------------------------------------------------------------------------------------  Cardiac Enzymes  Recent Labs Lab 05/06/16 1302  TROPONINI <0.03   ------------------------------------------------------------------------------------------------------------------  RADIOLOGY:  Dg Chest Portable 1 View  Result Date: 05/06/2016 CLINICAL DATA:  Generalize weakness and fever EXAM: PORTABLE CHEST 1 VIEW COMPARISON:  None FINDINGS: The heart size and mediastinal contours are within normal limits. Both lungs are clear. The visualized skeletal structures are unremarkable. IMPRESSION: No active disease. Electronically Signed   By: Signa Kell M.D.   On: 05/06/2016 14:01     IMPRESSION AND PLAN:   1. Clinical sepsis with lactic acidosis, fever, leukocytosis and hypotension on presentation. I'm not quite sure what I'm treating at this point. Follow-up blood cultures just in case this is endocarditis from recent tooth cleaning. Send stool studies just in case this is infectious diarrhea. Repeat a chest x-ray after IV fluid hydration just in case this is pneumonia not seen on the initial chest x-ray.  Patient is out in the woods and has been bitten by ticks in the past. ER physician gave vancomycin and Zosyn. I will switch antibiotics to Rocephin and doxycycline which would cover tickborne issues, pneumonia and endocarditis. If stool studies come back positive would end up switching antibiotics at that time. Check an echocardiogram. Check a pro-calcitonin. 2. Hypotension and syncope give IV fluid hydration. Check an echocardiogram 3. GERD on when necessary Prilosec 4. Impaired fasting glucose. Check a hemoglobin A1c   All the records are reviewed and case discussed with ED provider. Management plans discussed with the patient, family and they are in agreement.  CODE STATUS: Full code  TOTAL TIME TAKING CARE OF THIS PATIENT: 55 minutes.    Alford Highland M.D on 05/06/2016 at 3:33 PM  Between 7am to 6pm - Pager - [phone]  After 6pm call admission pager [phone]  [facility] [employer] Office  [phone]  CC: Primary care physician; Lynnea Ferrier, MD

## 2016-05-06 NOTE — ED Provider Notes (Signed)
[facility] Emergency Department Provider Note   ____________________________________________   First MD Initiated Contact with Patient 05/06/16 1300     (approximate)  I have reviewed the triage vital signs and the nursing notes.   HISTORY  Chief Complaint Diarrhea and Loss of Consciousness    HPI Bill Cooper is a 82 y.o. male with GERD, no other chronic medical problems who presents for evaluation of generalized weakness and hypotension in the setting of diarrhea, ongoing for the past 3 days, gradual, constant, severe. Patient denies any blood in his stools. He denies any abdominal pain. He did develop a fever this morning. He was seen at Dr. Carmon Sails office where he developed hypotension and lightheadedness, he did not faint, fall or injure himself. He denies any chest pain. No coughing, vomiting, no pain or burning with urination.   Past Medical History:  Diagnosis Date  . DVT (deep venous thrombosis) (HCC) 2012   after hip replacement  . GERD (gastroesophageal reflux disease)    rare  . Wears dentures    full upper, partial lower  . Wears hearing aid    bilateral    There are no active problems to display for this patient.   Past Surgical History:  Procedure Laterality Date  . APPENDECTOMY    . CATARACT EXTRACTION W/PHACO Right 12/28/2014   Procedure: CATARACT EXTRACTION PHACO AND INTRAOCULAR LENS PLACEMENT (IOC);  Surgeon: Lockie Mola, MD;  Location: [facility];  Service: Ophthalmology;  Laterality: Right;  . CHOLECYSTECTOMY    . COLONOSCOPY    . KNEE ARTHROSCOPY    . TOTAL HIP ARTHROPLASTY Bilateral     Prior to Admission medications   Medication Sig Start Date End Date Taking? Authorizing Provider  Multiple Vitamin (MULTIVITAMIN) capsule Take 1 capsule by mouth daily.   Yes Historical Provider, MD  omeprazole (PRILOSEC) 20 MG capsule Take 20 mg by mouth as needed.   Yes Historical Provider, MD     Allergies Review of patient's allergies indicates no known allergies.  No family history on file.  Social History Social History  Substance Use Topics  . Smoking status: Never Smoker  . Smokeless tobacco: Never Used  . Alcohol use No    Review of Systems Constitutional: N+ fever/chills Eyes: No visual changes. ENT: No sore throat. Cardiovascular: Denies chest pain. Respiratory: Denies shortness of breath. Gastrointestinal: No abdominal pain.  No nausea, no vomiting.  + diarrhea.  No constipation. Genitourinary: Negative for dysuria. Musculoskeletal: Negative for back pain. Skin: Negative for rash. Neurological: Negative for headaches, focal weakness or numbness.  10-point ROS otherwise negative.  ____________________________________________   PHYSICAL EXAM:  Vitals:   05/06/16 1300 05/06/16 1304 05/06/16 1330  BP: 95/60 104/62 (!) 106/56  Pulse: 65 68 66  Resp:  18 18  Temp:  98.8 F (37.1 C)   TempSrc:  Oral   SpO2: 100% 99% 97%  Weight:  171 lb (77.6 kg)   Height:  5\' 8"  (1.727 m)     VITAL SIGNS: ED Triage Vitals  Enc Vitals Group     BP      Pulse      Resp      Temp      Temp src      SpO2      Weight      Height      Head Circumference      Peak Flow      Pain Score  Pain Loc      Pain Edu?      Excl. in GC?     Constitutional: Alert and oriented. Nontoxic appearing and in no acute distress. Eyes: Conjunctivae are normal. PERRL. EOMI. Head: Atraumatic. Nose: No congestion/rhinnorhea. Mouth/Throat: Mucous membranes are moist.  Oropharynx non-erythematous. Neck: No stridor.  Supple without meningismus. Cardiovascular: Normal rate, regular rhythm. Grossly normal heart sounds.  Good peripheral circulation. Respiratory: Normal respiratory effort.  No retractions. Lungs CTAB. Gastrointestinal: Soft and nontender. No distention. Normal bowel sounds. No CVA tenderness. Genitourinary: Deferred Musculoskeletal: No lower extremity  tenderness nor edema.  No joint effusions. Neurologic:  Normal speech and language. No gross focal neurologic deficits are appreciated. No gait instability. Skin:  Skin is warm, dry and intact. No rash noted. Psychiatric: Mood and affect are normal. Speech and behavior are normal.  ____________________________________________   LABS (all labs ordered are listed, but only abnormal results are displayed)  Labs Reviewed  CBC WITH DIFFERENTIAL/PLATELET - Abnormal; Notable for the following:       Result Value   WBC 12.7 (*)    Neutro Abs 10.7 (*)    Lymphs Abs 0.8 (*)    All other components within normal limits  COMPREHENSIVE METABOLIC PANEL - Abnormal; Notable for the following:    Glucose, Bld 142 (*)    ALT 14 (*)    Total Bilirubin 1.3 (*)    All other components within normal limits  URINALYSIS COMPLETEWITH MICROSCOPIC ([facility] ONLY) - Abnormal; Notable for the following:    Color, Urine YELLOW (*)    APPearance CLEAR (*)    Ketones, ur TRACE (*)    Leukocytes, UA TRACE (*)    All other components within normal limits  LACTIC ACID, PLASMA - Abnormal; Notable for the following:    Lactic Acid, Venous 2.5 (*)    All other components within normal limits  CULTURE, BLOOD (ROUTINE X 2)  CULTURE, BLOOD (ROUTINE X 2)  C DIFFICILE QUICK SCREEN W PCR REFLEX  GASTROINTESTINAL PANEL BY PCR, STOOL (REPLACES STOOL CULTURE)  LIPASE, BLOOD  TROPONIN I  LACTIC ACID, PLASMA  LACTIC ACID, PLASMA  LACTIC ACID, PLASMA   ____________________________________________  EKG  ED ECG REPORT I, Gayla DossGayle, Alexsys Eskin A, the attending physician, personally viewed and interpreted this ECG.   Date: 05/06/2016  EKG Time: 13:04  Rate: 68  Rhythm: normal sinus rhythm  Axis: normal  Intervals:none  ST&T Change: No acute ST elevation or acute ST depression. Borderline nonspecific T-wave abnormalities.  ____________________________________________  RADIOLOGY  CXR IMPRESSION:  No active disease.       ____________________________________________   PROCEDURES  Procedure(s) performed: None  Procedures  Critical Care performed: Yes, see critical care note(s)    CRITICAL CARE Performed by: Toney RakesGayle, Jackolyn Geron A   Total critical care time: 35 minutes  Critical care time was exclusive of separately billable procedures and treating other patients.  Critical care was necessary to treat or prevent imminent or life-threatening deterioration.  Critical care was time spent personally by me on the following activities: development of treatment plan with patient and/or surrogate as well as nursing, discussions with consultants, evaluation of patient's response to treatment, examination of patient, obtaining history from patient or surrogate, ordering and performing treatments and interventions, ordering and review of laboratory studies, ordering and review of radiographic studies, pulse oximetry and re-evaluation of patient's condition.  ____________________________________________   INITIAL IMPRESSION / ASSESSMENT AND PLAN / ED COURSE  Pertinent labs & imaging results that were available during  my care of the patient were reviewed by me and considered in my medical decision making (see chart for details).  AMARU BURROUGHS is a 82 y.o. male with GERD, no other chronic medical problems who presents for evaluation of generalized weakness and hypotension in the setting of diarrhea, ongoing for the past 3 days, gradual, constant, severe. On exam, he is nontoxic appearing in no acute distress, vital signs are notable for mild hypotension on arrival however that is improving with IV fluids. The remainder is 5 signs are stable and he is afebrile. He has a benign abdominal examination. Obtain screening labs, give IV fluids, obtain blood cultures, lactic acid, chest x-ray, urinalysis and reassess for disposition.  ----------------------------------------- 2:17 PM on  05/06/2016 -----------------------------------------  CODE STATUS is initiated secondary to lactic acid elevation as well as white blood cell count elevation/leukocytosis. Patient will receive 30 mL/kg bolus of normal saline, empiric vancomycin and Zosyn and I anticipate he will be admitted.  ----------------------------------------- 2:54 PM on 05/06/2016 ----------------------------------------- CXR clear, UA not consistent with infection. Patient continues to appear well, BP stable. Stool studies ordered. Case discussed with Dr. Hilton Sinclair for admission at this time.  Clinical Course     ____________________________________________   FINAL CLINICAL IMPRESSION(S) / ED DIAGNOSES  Final diagnoses:  Sepsis, due to unspecified organism (HCC)  Diarrhea, unspecified type      NEW MEDICATIONS STARTED DURING THIS VISIT:  New Prescriptions   No medications on file     Note:  This document was prepared using Dragon voice recognition software and may include unintentional dictation errors.    Gayla Doss, MD 05/06/16 1455

## 2016-05-06 NOTE — ED Notes (Signed)
Code  Sepsis  Called  To  Doug  At  [facility] 

## 2016-05-06 NOTE — ED Notes (Signed)
EKG was signed and exported into the system.

## 2016-05-07 ENCOUNTER — Inpatient Hospital Stay: Payer: Medicare Other

## 2016-05-07 ENCOUNTER — Inpatient Hospital Stay
Admit: 2016-05-07 | Discharge: 2016-05-07 | Disposition: A | Discharge status: Home / Self Care | Payer: Medicare Other | Attending: Internal Medicine | Admitting: Internal Medicine

## 2016-05-07 LAB — CBC
HEMATOCRIT: 37 % — AB (ref 40.0–52.0)
HEMOGLOBIN: 12.6 g/dL — AB (ref 13.0–18.0)
MCH: 31.4 pg (ref 26.0–34.0)
MCHC: 34 g/dL (ref 32.0–36.0)
MCV: 92.5 fL (ref 80.0–100.0)
Platelets: 156 10*3/uL (ref 150–440)
RBC: 4 MIL/uL — AB (ref 4.40–5.90)
RDW: 13.2 % (ref 11.5–14.5)
WBC: 6.8 10*3/uL (ref 3.8–10.6)

## 2016-05-07 LAB — BASIC METABOLIC PANEL
ANION GAP: 4 — AB (ref 5–15)
BUN: 12 mg/dL (ref 6–20)
CHLORIDE: 112 mmol/L — AB (ref 101–111)
CO2: 24 mmol/L (ref 22–32)
Calcium: 7.7 mg/dL — ABNORMAL LOW (ref 8.9–10.3)
Creatinine, Ser: 0.8 mg/dL (ref 0.61–1.24)
GFR calc Af Amer: >60 mL/min (ref >60)
GFR calc non Af Amer: >60 mL/min (ref >60)
Glucose, Bld: 98 mg/dL (ref 65–99)
POTASSIUM: 3.7 mmol/L (ref 3.5–5.1)
SODIUM: 140 mmol/L (ref 135–145)

## 2016-05-07 LAB — C DIFFICILE QUICK SCREEN W PCR REFLEX
C DIFFICLE (CDIFF) ANTIGEN: NEGATIVE
C Diff interpretation: NOT DETECTED
C Diff toxin: NEGATIVE

## 2016-05-07 LAB — GASTROINTESTINAL PANEL BY PCR, STOOL (REPLACES STOOL CULTURE)
ASTROVIRUS: NOT DETECTED
Adenovirus F40/41: NOT DETECTED
CAMPYLOBACTER SPECIES: NOT DETECTED
CYCLOSPORA CAYETANENSIS: NOT DETECTED
Cryptosporidium: NOT DETECTED
ENTAMOEBA HISTOLYTICA: NOT DETECTED
ENTEROTOXIGENIC E COLI (ETEC): NOT DETECTED
Enteroaggregative E coli (EAEC): NOT DETECTED
Enteropathogenic E coli (EPEC): NOT DETECTED
Giardia lamblia: NOT DETECTED
Norovirus GI/GII: NOT DETECTED
PLESIMONAS SHIGELLOIDES: NOT DETECTED
Rotavirus A: NOT DETECTED
SALMONELLA SPECIES: NOT DETECTED
SAPOVIRUS (I, II, IV, AND V): NOT DETECTED
SHIGA LIKE TOXIN PRODUCING E COLI (STEC): NOT DETECTED
SHIGELLA/ENTEROINVASIVE E COLI (EIEC): NOT DETECTED
VIBRIO CHOLERAE: NOT DETECTED
VIBRIO SPECIES: NOT DETECTED
Yersinia enterocolitica: NOT DETECTED

## 2016-05-07 LAB — ROCKY MTN SPOTTED FVR ABS PNL(IGG+IGM)
RMSF IGG: NEGATIVE
RMSF IGM: 1.34 {index} — AB (ref 0.00–0.89)

## 2016-05-07 MED ORDER — DEXTROSE 5 % IV SOLN
2.0000 g | INTRAVENOUS | Status: DC
  Administered 2016-05-07: 2 g via INTRAVENOUS
  Filled 2016-05-07 (×2): qty 2

## 2016-05-07 MED ORDER — ACETAMINOPHEN 325 MG PO TABS
650.0000 mg | ORAL_TABLET | Freq: Four times a day (QID) | ORAL | Status: DC | PRN
  Filled 2016-05-07: qty 2

## 2016-05-07 NOTE — Progress Notes (Signed)
[facility] - [place] at [facility]   PATIENT NAME: Bill Cooper    MR#:  [id number]  DATE OF BIRTH:  06/21/1930  SUBJECTIVE:  CHIEF COMPLAINT:   Chief Complaint  Patient presents with  . Diarrhea  . Loss of Consciousness   Weakness. No BM.  REVIEW OF SYSTEMS:  Review of Systems  Constitutional: Negative for chills and fever.  HENT: Negative for sore throat.   Eyes: Negative for blurred vision and double vision.  Respiratory: Negative for cough, shortness of breath, wheezing and stridor.   Cardiovascular: Negative for chest pain and leg swelling.  Gastrointestinal: Negative for blood in stool, diarrhea, melena, nausea and vomiting.  Genitourinary: Negative for dysuria and urgency.  Musculoskeletal: Negative for joint pain.  Skin: Negative for itching and rash.  Neurological: Negative for dizziness, seizures, loss of consciousness and headaches.  Psychiatric/Behavioral: Negative for depression. The patient is not nervous/anxious.     DRUG ALLERGIES:  No Known Allergies VITALS:  Blood pressure (!) 108/56, pulse 62, temperature 97.5 F (36.4 C), temperature source Oral, resp. rate 16, height 5\' 8"  (1.727 m), weight 171 lb (77.6 kg), SpO2 95 %. PHYSICAL EXAMINATION:  Physical Exam  Constitutional: He is oriented to person, place, and time and well-developed, well-nourished, and in no distress. No distress.  HENT:  Head: Normocephalic.  Mouth/Throat: Oropharynx is clear and moist.  Eyes: Conjunctivae and EOM are normal. No scleral icterus.  Neck: Normal range of motion. Neck supple. No JVD present.  Cardiovascular: Normal rate, regular rhythm and normal heart sounds.  Exam reveals no gallop.   No murmur heard. Pulmonary/Chest: Effort normal and breath sounds normal. No respiratory distress. He has no wheezes.  Abdominal: Soft. Bowel sounds are normal. He exhibits no distension. There is no tenderness.  Musculoskeletal: Normal range of motion. He exhibits  no edema or tenderness.  Lymphadenopathy:    He has no cervical adenopathy.  Neurological: He is alert and oriented to person, place, and time. No cranial nerve deficit.  Skin: Skin is warm.  Psychiatric: Affect and judgment normal.   LABORATORY PANEL:   CBC  Recent Labs Lab 05/07/16 0539  WBC 6.8  HGB 12.6*  HCT 37.0*  PLT 156   ------------------------------------------------------------------------------------------------------------------ Chemistries   Recent Labs Lab 05/06/16 1302 05/07/16 0539  NA 136 140  K 3.7 3.7  CL 104 112*  CO2 22 24  GLUCOSE 142* 98  BUN 14 12  CREATININE 0.95 0.80  CALCIUM 9.0 7.7*  AST 28  --   ALT 14*  --   ALKPHOS 72  --   BILITOT 1.3*  --    RADIOLOGY:  Dg Chest 2 View  Result Date: 05/07/2016 CLINICAL DATA:  Fever EXAM: CHEST  2 VIEW COMPARISON:  Portable chest x-ray of May 06, 2016 FINDINGS: The lungs are mildly hypoinflated. There is no focal infiltrate. There is no pleural effusion. The heart and pulmonary vascularity are normal. The mediastinum is normal in width. There is calcification in the wall of the aortic arch. There is no pleural effusion. The observed bony thorax is unremarkable. IMPRESSION: There is no active cardiopulmonary disease. Electronically Signed   By: David  SwazilandJordan M.D.   On: 05/07/2016 07:45   ASSESSMENT AND PLAN:   1. Clinical sepsis with lactic acidosis, fever, leukocytosis and hypotension on presentation.  On Rocephin and doxycycline. Follow-up blood cultures and stool studies. No C diff.  Leukocytosis improved. F/u echocardiogram. Check a pro-calcitonin. 2. Hypotension and syncope, improved with IV fluid  hydration. F/u echocardiogram 3. GERD on when necessary Prilosec 4. Impaired fasting glucose. Check a hemoglobin A1c Diarrhea. No BM after admission. Lactic acidosis. Improved.  All the records are reviewed and case discussed with Care Management/Social Worker. Management plans discussed with  the patient, his wife and they are in agreement.  CODE STATUS: full code.  TOTAL TIME TAKING CARE OF THIS PATIENT: 33 minutes.   More than 50% of the time was spent in counseling/coordination of care: YES  POSSIBLE D/C IN 1-2 DAYS, DEPENDING ON CLINICAL CONDITION.   Shaune Pollack M.D on 05/07/2016 at 2:24 PM  Between 7am to 6pm - Pager - [phone]  After 6pm go to [email or web address] - Social research officer, government  [facility] [place] [facility]  Office  [phone]  CC: Primary care physician; Lynnea Ferrier, MD  Note: This dictation was prepared with Dragon dictation along with smaller phrase technology. Any transcriptional errors that result from this process are unintentional.

## 2016-05-07 NOTE — Care Management Important Message (Signed)
Important Message  Patient Details  Name: Dallie DadBeacham H Rolin MRN: [id number] Date of Birth: 02/26/1930   Medicare Important Message Given:  Yes    Gwenette GreetBrenda S Jocelin Schuelke, RN 05/07/2016, 11:22 AM

## 2016-05-07 NOTE — Evaluation (Signed)
Physical Therapy Evaluation Patient Details Name: TREYVONE CHELF MRN: [id number] DOB: May 06, 1930 Today's Date: 05/07/2016   History of Present Illness  82 y/o male here with weakness, diarrhea and ultimately admitted with sepsis.    Clinical Impression  Pt did well with PT exam and generally showed good confidence with mobility and ambulation.  He is a little weaker than his baseline but is safe to return home does not need further PT intervention.  Pt able to ambulate easily and with good speed around the nurses' station and showed no balance or other safety concerns.     Follow Up Recommendations No PT follow up    Equipment Recommendations       Recommendations for Other Services       Precautions / Restrictions Restrictions Weight Bearing Restrictions: No      Mobility  Bed Mobility Overal bed mobility: Independent             General bed mobility comments: Pt is able to rise to EOB w/o assist and shows good confidence and safety  Transfers Overall transfer level: Independent Equipment used: None             General transfer comment: Pt is able to rise to standing without AD and shows good confidence and safety.  Ambulation/Gait Ambulation/Gait assistance: Independent Ambulation Distance (Feet): 250 Feet Assistive device: None       General Gait Details: Pt initially with some hesitancy, but ultimately did well with ambulation showing appropriate speed, good confidence and no safety issues.  He has very little fatigue with the effort and ultimately did well.   Stairs            Wheelchair Mobility    Modified Rankin (Stroke Patients Only)       Balance Overall balance assessment: No apparent balance deficits (not formally assessed)                                           Pertinent Vitals/Pain Pain Assessment: No/denies pain    Home Living Family/patient expects to be discharged to:: Private residence Living  Arrangements: Spouse/significant other   Type of Home: House Home Access: Stairs to enter Entrance Stairs-Rails:  (yes) Entrance Stairs-Number of Steps: 3   Home Equipment: Walker - 2 wheels;Cane - single point      Prior Function Level of Independence: Independent         Comments: Pt able to be very active with gardening, working outside, driving, etc.     Higher education careers adviser        Extremity/Trunk Assessment   Upper Extremity Assessment: Overall WFL for tasks assessed           Lower Extremity Assessment: Overall WFL for tasks assessed         Communication   Communication: No difficulties  Cognition Arousal/Alertness: Awake/alert Behavior During Therapy: WFL for tasks assessed/performed Overall Cognitive Status: Within Functional Limits for tasks assessed                      General Comments      Exercises     Assessment/Plan    PT Assessment Patent does not need any further PT services  PT Problem List            PT Treatment Interventions      PT Goals (Current goals can be  found in the Care Plan section)  Acute Rehab PT Goals Patient Stated Goal: go home and take care of my garden    Frequency     Barriers to discharge        Co-evaluation               End of Session Equipment Utilized During Treatment: Gait belt Activity Tolerance: Patient tolerated treatment well Patient left: with call bell/phone within reach;with chair alarm set;with family/visitor present Nurse Communication: Mobility status         Time: [id number] PT Time Calculation (min) (ACUTE ONLY): 18 min   Charges:   PT Evaluation $PT Eval Low Complexity: 1 Procedure     PT G CodesMalachi Pro:        Haya Hemler R Khadeejah Castner , DPT 05/07/2016, 10:11 AM

## 2016-05-07 NOTE — Progress Notes (Signed)
*  PRELIMINARY RESULTS* Echocardiogram 2D Echocardiogram has been performed.  Cristela BlueHege, Cortny Bambach 05/07/2016, 3:23 PM

## 2016-05-08 LAB — ECHOCARDIOGRAM COMPLETE
Height: 68 in
Weight: 2736 oz

## 2016-05-08 LAB — PROCALCITONIN: PROCALCITONIN: 0.45 ng/mL

## 2016-05-08 MED ORDER — DOXYCYCLINE HYCLATE 100 MG PO TABS: 100.0000 mg | ORAL_TABLET | Freq: Two times a day (BID) | ORAL | 0 refills | Status: DC

## 2016-05-08 NOTE — Progress Notes (Signed)
Pt being discharged home, discharge instructions and prescriptions reviewed with pt and wife, states understanding, pt with no complaints, no distress or discomfort noted

## 2016-05-08 NOTE — Discharge Summary (Signed)
[facility] [place] [facility]   PATIENT NAME: Bill Cooper    MR#:  [id number]  DATE OF BIRTH:  1930-01-14  DATE OF ADMISSION:  05/06/2016 ADMITTING PHYSICIAN: Alford Highland, MD  DATE OF DISCHARGE: 05/08/2016  9:20 AM  PRIMARY CARE PHYSICIAN: Curtis Sites III, MD    ADMISSION DIAGNOSIS:  Fever [R50.9] Sepsis, due to unspecified organism (HCC) [A41.9] Diarrhea, unspecified type [R19.7]  DISCHARGE DIAGNOSIS:  Active Problems:   Sepsis (HCC)   SECONDARY DIAGNOSIS:   Past Medical History:  Diagnosis Date  . DVT (deep venous thrombosis) (HCC) 2012   after hip replacement  . GERD (gastroesophageal reflux disease)    rare  . Wears dentures    full upper, partial lower  . Wears hearing aid    bilateral    HOSPITAL COURSE:   1. Patient was admitted with clinical sepsis but no real clear cause. Both cultures were negative. Repeat chest x-ray was negative. Patient was given vancomycin and Zosyn by ER physician. I switched her to Rocephin and doxycycline. The patient had no further temperature. I sent him home on a course of doxycycline just in case this was tickborne illness. Could also be a viral illness. 2. Syncope secondary to dehydration and relative hypotension. Patient was advised to cut back on soda intake and drink water instead. 3. Diarrhea stool for C. difficile and gastrointestinal panel was negative. 4. GERD on PPI  DISCHARGE CONDITIONS:   Satisfactory  CONSULTS OBTAINED:   none  DRUG ALLERGIES:  No Known Allergies  DISCHARGE MEDICATIONS:   Discharge Medication List as of 05/08/2016  8:46 AM    START taking these medications   Details  doxycycline (VIBRA-TABS) 100 MG tablet Take 1 tablet (100 mg total) by mouth every 12 (twelve) hours., Starting Wed 05/08/2016, Print      CONTINUE these medications which have NOT CHANGED   Details  Multiple Vitamin (MULTIVITAMIN) capsule Take 1 capsule by mouth daily., Historical Med     omeprazole (PRILOSEC) 20 MG capsule Take 20 mg by mouth as needed., Historical Med         DISCHARGE INSTRUCTIONS:    Follow-up PMD one week  If you experience worsening of your admission symptoms, develop shortness of breath, life threatening emergency, suicidal or homicidal thoughts you must seek medical attention immediately by calling 911 or calling your MD immediately  if symptoms less severe.  You Must read complete instructions/literature along with all the possible adverse reactions/side effects for all the Medicines you take and that have been prescribed to you. Take any new Medicines after you have completely understood and accept all the possible adverse reactions/side effects.   Please note  You were cared for by a hospitalist during your hospital stay. If you have any questions about your discharge medications or the care you received while you were in the hospital after you are discharged, you can call the unit and asked to speak with the hospitalist on call if the hospitalist that took care of you is not available. Once you are discharged, your primary care physician will handle any further medical issues. Please note that NO REFILLS for any discharge medications will be authorized once you are discharged, as it is imperative that you return to your primary care physician (or establish a relationship with a primary care physician if you do not have one) for your aftercare needs so that they can reassess your need for medications and monitor your lab values.  Today   CHIEF COMPLAINT:   Chief Complaint  Patient presents with  . Diarrhea  . Loss of Consciousness    HISTORY OF PRESENT ILLNESS:  Bill Cooper  is a 82 y.o. male presented with diarrhea fever and syncope   VITAL SIGNS:  Blood pressure (!) 116/53, pulse 64, temperature 98.4 F (36.9 C), temperature source Oral, resp. rate (!) 23, height 5\' 8"  (1.727 m), weight 77.6 kg (171 lb), SpO2 95 %. Respiratory  rate was 16 on my count on discharge   PHYSICAL EXAMINATION:  GENERAL:  82 y.o.-year-old patient lying in the bed with no acute distress.  EYES: Pupils equal, round, reactive to light and accommodation. No scleral icterus. Extraocular muscles intact.  HEENT: Head atraumatic, normocephalic. Oropharynx and nasopharynx clear.  NECK:  Supple, no jugular venous distention. No thyroid enlargement, no tenderness.  LUNGS: Normal breath sounds bilaterally, no wheezing, rales,rhonchi or crepitation. No use of accessory muscles of respiration.  CARDIOVASCULAR: S1, S2 normal. No murmurs, rubs, or gallops.  ABDOMEN: Soft, non-tender, non-distended. Bowel sounds present. No organomegaly or mass.  EXTREMITIES: No pedal edema, cyanosis, or clubbing.  NEUROLOGIC: Cranial nerves II through XII are intact. Muscle strength 5/5 in all extremities. Sensation intact. Gait not checked.  PSYCHIATRIC: The patient is alert and oriented x 3.  SKIN: No obvious rash, lesion, or ulcer.   DATA REVIEW:   CBC  Recent Labs Lab 05/07/16 0539  WBC 6.8  HGB 12.6*  HCT 37.0*  PLT 156    Chemistries   Recent Labs Lab 05/06/16 1302 05/07/16 0539  NA 136 140  K 3.7 3.7  CL 104 112*  CO2 22 24  GLUCOSE 142* 98  BUN 14 12  CREATININE 0.95 0.80  CALCIUM 9.0 7.7*  AST 28  --   ALT 14*  --   ALKPHOS 72  --   BILITOT 1.3*  --     Cardiac Enzymes  Recent Labs Lab 05/06/16 1302  TROPONINI <0.03    Microbiology Results  Results for orders placed or performed during the hospital encounter of 05/06/16  Blood culture (routine x 2)     Status: None (Preliminary result)   Collection Time: 05/06/16  1:03 PM  Result Value Ref Range Status   Specimen Description BLOOD RIGHT AC  Final   Special Requests   Final    BOTTLES DRAWN AEROBIC AND ANAEROBIC AER 7ML ANA 9ML   Culture NO GROWTH 2 DAYS  Final   Report Status PENDING  Incomplete  Blood culture (routine x 2)     Status: None (Preliminary result)    Collection Time: 05/06/16  1:03 PM  Result Value Ref Range Status   Specimen Description BLOOD LEFT AC  Final   Special Requests   Final    BOTTLES DRAWN AEROBIC AND ANAEROBIC AER 9ML ANA 8ML   Culture NO GROWTH 2 DAYS  Final   Report Status PENDING  Incomplete  C difficile quick scan w PCR reflex     Status: None   Collection Time: 05/07/16  9:02 AM  Result Value Ref Range Status   C Diff antigen NEGATIVE NEGATIVE Final   C Diff toxin NEGATIVE NEGATIVE Final   C Diff interpretation No C. difficile detected.  Final  Gastrointestinal Panel by PCR , Stool     Status: None   Collection Time: 05/07/16  9:02 AM  Result Value Ref Range Status   Campylobacter species NOT DETECTED NOT DETECTED Final   Plesimonas shigelloides NOT DETECTED NOT  DETECTED Final   Salmonella species NOT DETECTED NOT DETECTED Final   Yersinia enterocolitica NOT DETECTED NOT DETECTED Final   Vibrio species NOT DETECTED NOT DETECTED Final   Vibrio cholerae NOT DETECTED NOT DETECTED Final   Enteroaggregative E coli (EAEC) NOT DETECTED NOT DETECTED Final   Enteropathogenic E coli (EPEC) NOT DETECTED NOT DETECTED Final   Enterotoxigenic E coli (ETEC) NOT DETECTED NOT DETECTED Final   Shiga like toxin producing E coli (STEC) NOT DETECTED NOT DETECTED Final   Shigella/Enteroinvasive E coli (EIEC) NOT DETECTED NOT DETECTED Final   Cryptosporidium NOT DETECTED NOT DETECTED Final   Cyclospora cayetanensis NOT DETECTED NOT DETECTED Final   Entamoeba histolytica NOT DETECTED NOT DETECTED Final   Giardia lamblia NOT DETECTED NOT DETECTED Final   Adenovirus F40/41 NOT DETECTED NOT DETECTED Final   Astrovirus NOT DETECTED NOT DETECTED Final   Norovirus GI/GII NOT DETECTED NOT DETECTED Final   Rotavirus A NOT DETECTED NOT DETECTED Final   Sapovirus (I, II, IV, and V) NOT DETECTED NOT DETECTED Final    RADIOLOGY:  Dg Chest 2 View  Result Date: 05/07/2016 CLINICAL DATA:  Fever EXAM: CHEST  2 VIEW COMPARISON:  Portable chest  x-ray of May 06, 2016 FINDINGS: The lungs are mildly hypoinflated. There is no focal infiltrate. There is no pleural effusion. The heart and pulmonary vascularity are normal. The mediastinum is normal in width. There is calcification in the wall of the aortic arch. There is no pleural effusion. The observed bony thorax is unremarkable. IMPRESSION: There is no active cardiopulmonary disease. Electronically Signed   By: David  [place] M.D.   On: 05/07/2016 07:45    Management plans discussed with the patient, family and they are in agreement.  CODE STATUS:  Code Status History    Date Active Date Inactive Code Status Order ID Comments User Context   05/06/2016  3:25 PM 05/08/2016 12:26 PM Full Code [id number]  Alford Highland, MD ED    Advance Directive Documentation   Flowsheet Row Most Recent Value  Type of Advance Directive  Healthcare Power of Attorney  Pre-existing out of facility DNR order (yellow form or pink MOST form)  No data  "MOST" Form in Place?  No data      TOTAL TIME TAKING CARE OF THIS PATIENT: 35 minutes.    Alford Highland M.D on 05/08/2016 at 4:04 PM  Between 7am to 6pm - Pager - [phone]  After 6pm go to [email or web address] - password [email or web address]  [facility] Office  [phone]  CC: Primary care physician; Lynnea Ferrier, MD

## 2016-05-11 LAB — CULTURE, BLOOD (ROUTINE X 2)
CULTURE: NO GROWTH
Culture: NO GROWTH

## 2016-06-18 DIAGNOSIS — Z96649 Presence of unspecified artificial hip joint: Secondary | ICD-10-CM | POA: Insufficient documentation

## 2016-06-20 ENCOUNTER — Encounter: Payer: Self-pay | Admitting: *Deleted

## 2016-07-02 ENCOUNTER — Ambulatory Visit (INDEPENDENT_AMBULATORY_CARE_PROVIDER_SITE_OTHER): Payer: Medicare Other | Admitting: General Surgery

## 2016-07-02 ENCOUNTER — Encounter: Payer: Self-pay | Admitting: General Surgery

## 2016-07-02 VITALS — BP 114/66 | HR 78 | Resp 16 | Ht 69.0 in | Wt 176.0 lb

## 2016-07-02 DIAGNOSIS — R1030 Lower abdominal pain, unspecified: Secondary | ICD-10-CM | POA: Diagnosis not present

## 2016-07-02 NOTE — Progress Notes (Signed)
Patient ID: Bill Cooper, male   DOB: 03/20/1930, 83 y.o.   MRN: [id number]  Chief Complaint  Patient presents with  . Abdominal Pain    HPI Bill Cooper is a 82 y.o. male here today for an evalaution of lower abdominal pain. He states he has had pain intermittently for several months. He states he was in [place] before Thanksgiving and he had constant lower abdominal pain for 3 days. He states his max temp during that time was 102 F, but remained stable around 99 F. He denies nausea or vomiting. The pain does not wake him up at night.  No associated food triggers the pain and the pain does not wake him up at night. Bowels move daily and no rectal bleeding noted. He states when he needs to urinate in the middle of the night, he only voids a small amount. He was hospitalized in October with sepsis, no defined source identified. I have reviewed the history of present illness with the patient.   HPI  Past Medical History:  Diagnosis Date  . DVT (deep venous thrombosis) (HCC) 2012   after hip replacement  . GERD (gastroesophageal reflux disease)    rare  . Wears dentures    full upper, partial lower  . Wears hearing aid    bilateral    Past Surgical History:  Procedure Laterality Date  . APPENDECTOMY    . CATARACT EXTRACTION W/PHACO Right 12/28/2014   Procedure: CATARACT EXTRACTION PHACO AND INTRAOCULAR LENS PLACEMENT (IOC);  Surgeon: Lockie Mola, MD;  Location: [facility];  Service: Ophthalmology;  Laterality: Right;  . CHOLECYSTECTOMY    . COLONOSCOPY  2006  . KNEE ARTHROSCOPY    . TOTAL HIP ARTHROPLASTY Bilateral   . UPPER GI ENDOSCOPY  2006    Family History  Problem Relation Age of Onset  . CAD Father   . Colon cancer Brother   . Liver cancer Brother   . Lung cancer Brother     and colon  . Prostate cancer Brother     Social History Social History  Substance Use Topics  . Smoking status: Never Smoker  . Smokeless tobacco: Never Used  .  Alcohol use No    No Known Allergies  Current Outpatient Prescriptions  Medication Sig Dispense Refill  . Multiple Vitamin (MULTIVITAMIN) capsule Take 1 capsule by mouth daily.    Marland Kitchen omeprazole (PRILOSEC) 20 MG capsule Take 20 mg by mouth as needed.     No current facility-administered medications for this visit.     Review of Systems Review of Systems  Constitutional: Negative.   Respiratory: Negative.   Cardiovascular: Negative.     Blood pressure 114/66, pulse 78, resp. rate 16, height 5\' 9"  (1.753 m), weight 176 lb (79.8 kg).  Physical Exam Physical Exam  Constitutional: He is oriented to person, place, and time. He appears well-developed and well-nourished.  Eyes: Conjunctivae are normal. No scleral icterus.  Neck: Neck supple.  Cardiovascular: Normal rate, regular rhythm and normal heart sounds.   Pulmonary/Chest: Effort normal and breath sounds normal.  Abdominal: Soft. Bowel sounds are normal. There is tenderness in the right lower quadrant, suprapubic area and left lower quadrant. There is no rebound and no guarding. No hernia. Hernia confirmed negative in the right inguinal area and confirmed negative in the left inguinal area.  Lymphadenopathy:    He has no cervical adenopathy.  Neurological: He is alert and oriented to person, place, and time.  Skin: Skin is warm  and dry.    Data Reviewed Notes from prior hospitalization and previous notes  Assessment    Lower abdominal pain, unspecified    Plan     Patient to be scheduled for CT abdomen/pelvis to further evaluate abdominal pain. Discussed fully with the patient and he is agreeable.  The patient is scheduled for a CT at [place] on 07/09/16 at 11:30 am. He will arrive by 11:15 am and have nothing to eat or drink for 4 hours prior. He will pick up a prep kit today. The patient is aware of date, time, and instructions.   Loribeth Katich G 07/02/2016, 4:31 PM

## 2016-07-02 NOTE — Patient Instructions (Addendum)
Patient to be scheduled for CT abdomen/pelvis  The patient is scheduled for a CT at [place] on 07/09/16 at 11:30 am. He will arrive by 11:15 am and have nothing to eat or drink for 4 hours prior. He will pick up a prep kit today. The patient is aware of date, time, and instructions.

## 2016-07-09 ENCOUNTER — Ambulatory Visit
Admission: RE | Admit: 2016-07-09 | Discharge: 2016-07-09 | Disposition: A | Discharge status: Home / Self Care | Payer: Medicare Other | Source: Ambulatory Visit | Attending: General Surgery | Admitting: General Surgery

## 2016-07-09 DIAGNOSIS — K760 Fatty (change of) liver, not elsewhere classified: Secondary | ICD-10-CM | POA: Diagnosis not present

## 2016-07-09 DIAGNOSIS — Z9049 Acquired absence of other specified parts of digestive tract: Secondary | ICD-10-CM | POA: Insufficient documentation

## 2016-07-09 DIAGNOSIS — R1030 Lower abdominal pain, unspecified: Secondary | ICD-10-CM | POA: Diagnosis not present

## 2016-07-09 DIAGNOSIS — N281 Cyst of kidney, acquired: Secondary | ICD-10-CM | POA: Diagnosis not present

## 2016-07-09 MED ORDER — IOPAMIDOL (ISOVUE-300) INJECTION 61%
100.0000 mL | Freq: Once | INTRAVENOUS | Status: AC | PRN
  Administered 2016-07-09: 100 mL via INTRAVENOUS

## 2016-07-15 ENCOUNTER — Encounter: Payer: Self-pay | Admitting: General Surgery

## 2016-07-15 ENCOUNTER — Ambulatory Visit (INDEPENDENT_AMBULATORY_CARE_PROVIDER_SITE_OTHER): Payer: Medicare Other | Admitting: General Surgery

## 2016-07-15 VITALS — BP 128/68 | HR 72 | Resp 14 | Ht 69.0 in | Wt 173.0 lb

## 2016-07-15 DIAGNOSIS — R1030 Lower abdominal pain, unspecified: Secondary | ICD-10-CM

## 2016-07-15 NOTE — Progress Notes (Signed)
Patient ID: Bill Cooper, male   DOB: 06/15/1930, 82 y.o.   MRN: [id number]  Chief Complaint  Patient presents with  . Follow-up    CT done 07/09/16    HPI Bill Cooper is a 82 y.o. male is here today for a CT and lower abd pain follow up. CT was done on 07/09/16. Patient states he is feeling much better.  I have reviewed the history of present illness with the patient.  HPI  Past Medical History:  Diagnosis Date  . DVT (deep venous thrombosis) (HCC) 2012   after hip replacement  . GERD (gastroesophageal reflux disease)    rare  . Wears dentures    full upper, partial lower  . Wears hearing aid    bilateral    Past Surgical History:  Procedure Laterality Date  . APPENDECTOMY    . CATARACT EXTRACTION W/PHACO Right 12/28/2014   Procedure: CATARACT EXTRACTION PHACO AND INTRAOCULAR LENS PLACEMENT (IOC);  Surgeon: Lockie Molahadwick Brasington, MD;  [facility];  Service: Ophthalmology;  Laterality: Right;  . CHOLECYSTECTOMY    . COLONOSCOPY  2006  . KNEE ARTHROSCOPY    . TOTAL HIP ARTHROPLASTY Bilateral   . UPPER GI ENDOSCOPY  2006    Family History  Problem Relation Age of Onset  . CAD Father   . Colon cancer Brother   . Liver cancer Brother   . Lung cancer Brother     and colon  . Prostate cancer Brother     Social History Social History  Substance Use Topics  . Smoking status: Never Smoker  . Smokeless tobacco: Never Used  . Alcohol use No    No Known Allergies  Current Outpatient Prescriptions  Medication Sig Dispense Refill  . Multiple Vitamin (MULTIVITAMIN) capsule Take 1 capsule by mouth daily.    Marland Kitchen. omeprazole (PRILOSEC) 20 MG capsule Take 20 mg by mouth as needed.     No current facility-administered medications for this visit.     Review of Systems Review of Systems  Constitutional: Negative.   Respiratory: Negative.   Cardiovascular: Negative.   Gastrointestinal: Negative.     Blood pressure 128/68, pulse 72, resp. rate 14,  height 5\' 9"  (1.753 m), weight 173 lb (78.5 kg).  Physical Exam Physical Exam  Constitutional: He is oriented to person, place, and time. He appears well-developed and well-nourished.  Cardiovascular: Normal rate, regular rhythm and normal heart sounds.   Pulmonary/Chest: Effort normal and breath sounds normal.  Neurological: He is alert and oriented to person, place, and time.  Skin: Skin is warm and dry.   Abdominal exam is once again pretty benign. CT had suggested a small fat-containing hernia in the right inguinal area but this is not clinically palpable. Data Reviewed Ct scan reviewed -no findings of concern except for a small fat-containing hernia in the right groin.  Assessment    Lower abd pain- no apparent source. Pt does seem to be feeling better. Small right ing hernia only seen on CT-not palpable.    Plan   Will reassess in 2 mos.    Return in two months.   This information has been scribed by Ples SpecterJessica Qualls CMA.   Natausha Jungwirth G 07/15/2016, 2:49 PM

## 2016-07-15 NOTE — Patient Instructions (Addendum)
Return in two months

## 2016-07-31 ENCOUNTER — Encounter: Payer: Self-pay | Admitting: General Surgery

## 2016-08-08 ENCOUNTER — Encounter: Payer: Self-pay | Admitting: General Surgery

## 2016-09-17 ENCOUNTER — Encounter: Payer: Self-pay | Admitting: General Surgery

## 2016-09-17 ENCOUNTER — Ambulatory Visit (INDEPENDENT_AMBULATORY_CARE_PROVIDER_SITE_OTHER): Payer: Medicare Other | Admitting: General Surgery

## 2016-09-17 VITALS — BP 130/80 | HR 82 | Temp 97.0°F | Resp 16 | Ht 69.0 in | Wt 179.0 lb

## 2016-09-17 DIAGNOSIS — R1013 Epigastric pain: Secondary | ICD-10-CM | POA: Diagnosis not present

## 2016-09-17 NOTE — Patient Instructions (Addendum)
The patient is aware to call back for any questions or concerns. The patient is scheduled for a CT abdomen and pelvis with contrast for 09/18/16 at 9:30 am. He will arrive at 9:15 am. He will pick up a prep kit today. He will need to have nothing to eat or drink for 4 hours prior. The patient is aware of date, time, and instructions.

## 2016-09-17 NOTE — Progress Notes (Signed)
Patient ID: Bill Cooper, male   DOB: 1930/07/02, 83 y.o.   MRN: [id number]  Chief Complaint  Patient presents with  . Fever    HPI Bill Cooper is a 83 y.o. male.  Here today for complaints of fever and stomach pains, upper abdomen.  He states this episode started Sunday. He states this pain is different than the pain he had back in December which was in lower abdomen. He denies nausea or vomiting. Denies constipation or diarrhea. He states his fever has been as high as 101.3- was up and down for last 2 days. He did feel better this am and his temp was normal. Right now he is again having pain in area above umbilicus.. I have reviewed the history of present illness with the patient.    HPI  Past Medical History:  Diagnosis Date  . DVT (deep venous thrombosis) ([place]) 2012   after hip replacement  . GERD (gastroesophageal reflux disease)    rare  . Wears dentures    full upper, partial lower  . Wears hearing aid    bilateral    Past Surgical History:  Procedure Laterality Date  . APPENDECTOMY    . CATARACT EXTRACTION W/PHACO Right 12/28/2014   Procedure: CATARACT EXTRACTION PHACO AND INTRAOCULAR LENS PLACEMENT (IOC);  Surgeon: Leandrew Koyanagi, MD;  Location: [facility];  Service: Ophthalmology;  Laterality: Right;  . CHOLECYSTECTOMY    . COLONOSCOPY  2006  . KNEE ARTHROSCOPY    . TOTAL HIP ARTHROPLASTY Bilateral   . UPPER GI ENDOSCOPY  2006    Family History  Problem Relation Age of Onset  . CAD Father   . Colon cancer Brother   . Liver cancer Brother   . Lung cancer Brother     and colon  . Prostate cancer Brother     Social History Social History  Substance Use Topics  . Smoking status: Never Smoker  . Smokeless tobacco: Never Used  . Alcohol use No    No Known Allergies  Current Outpatient Prescriptions  Medication Sig Dispense Refill  . Multiple Vitamin (MULTIVITAMIN) capsule Take 1 capsule by mouth daily.    Marland Kitchen omeprazole (PRILOSEC) 20  MG capsule Take 20 mg by mouth as needed.     No current facility-administered medications for this visit.     Review of Systems Review of Systems  Constitutional: Positive for fever.  Respiratory: Negative.   Cardiovascular: Negative.   Gastrointestinal: Negative for constipation, diarrhea, nausea and vomiting.    Blood pressure 130/80, pulse 82, temperature 97 F (36.1 C), temperature source Oral, resp. rate 16, height '5\' 9"'  (1.753 m), weight 179 lb (81.2 kg).  Physical Exam Physical Exam  Constitutional: He appears well-developed and well-nourished.  HENT:  Mouth/Throat: Oropharynx is clear and moist.  Eyes: Conjunctivae are normal. No scleral icterus.  Neck: Neck supple.  Cardiovascular: Normal rate, regular rhythm and normal heart sounds.   Pulmonary/Chest: Effort normal and breath sounds normal.  Abdominal: Soft. Normal appearance and bowel sounds are normal. There is no hepatomegaly. There is tenderness. No hernia.    Lymphadenopathy:    He has no cervical adenopathy.  Skin: Skin is warm and dry.  Psychiatric: His behavior is normal.    Data Reviewed  Progress notes CT from Dec 2017 was normal save for a tiny fatty hernia in right groin Assessment    Abdominal pain in epigastric region, no apparent cause.     Plan    CBC, Met C,  Lipase. CT abd/pelvis with contrast. Further plan based on lab abd CT findings. Advised use of Prilosec today- he has had mild gastritis documented by endoscopy before.    This information has been scribed by Karie Fetch RN, BSN,BC.    Shatori Bertucci G 09/17/2016, 1:36 PM

## 2016-09-18 ENCOUNTER — Telehealth: Payer: Self-pay | Admitting: *Deleted

## 2016-09-18 ENCOUNTER — Ambulatory Visit
Admission: RE | Admit: 2016-09-18 | Discharge: 2016-09-18 | Disposition: A | Discharge status: Home / Self Care | Payer: Medicare Other | Source: Ambulatory Visit | Attending: General Surgery | Admitting: General Surgery

## 2016-09-18 DIAGNOSIS — R1013 Epigastric pain: Secondary | ICD-10-CM | POA: Insufficient documentation

## 2016-09-18 DIAGNOSIS — K5710 Diverticulosis of small intestine without perforation or abscess without bleeding: Secondary | ICD-10-CM | POA: Insufficient documentation

## 2016-09-18 DIAGNOSIS — I2510 Atherosclerotic heart disease of native coronary artery without angina pectoris: Secondary | ICD-10-CM | POA: Diagnosis not present

## 2016-09-18 DIAGNOSIS — I700 Atherosclerosis of aorta: Secondary | ICD-10-CM | POA: Diagnosis not present

## 2016-09-18 DIAGNOSIS — R937 Abnormal findings on diagnostic imaging of other parts of musculoskeletal system: Secondary | ICD-10-CM | POA: Insufficient documentation

## 2016-09-18 LAB — COMPREHENSIVE METABOLIC PANEL
A/G RATIO: 1.4 (ref 1.2–2.2)
ALT: 12 IU/L (ref 0–44)
AST: 14 IU/L (ref 0–40)
Albumin: 3.6 g/dL (ref 3.5–4.7)
Alkaline Phosphatase: 59 IU/L (ref 39–117)
BILIRUBIN TOTAL: 0.5 mg/dL (ref 0.0–1.2)
BUN/Creatinine Ratio: 24 (ref 10–24)
BUN: 18 mg/dL (ref 8–27)
CHLORIDE: 101 mmol/L (ref 96–106)
CO2: 23 mmol/L (ref 18–29)
Calcium: 8.3 mg/dL — ABNORMAL LOW (ref 8.6–10.2)
Creatinine, Ser: 0.76 mg/dL (ref 0.76–1.27)
GFR calc non Af Amer: 84 mL/min/{1.73_m2} (ref >59)
GFR, EST AFRICAN AMERICAN: 97 mL/min/{1.73_m2} (ref >59)
Globulin, Total: 2.6 g/dL (ref 1.5–4.5)
Glucose: 143 mg/dL — ABNORMAL HIGH (ref 65–99)
POTASSIUM: 3.8 mmol/L (ref 3.5–5.2)
SODIUM: 141 mmol/L (ref 134–144)
Total Protein: 6.2 g/dL (ref 6.0–8.5)

## 2016-09-18 LAB — CBC WITH DIFFERENTIAL/PLATELET
BASOS ABS: 0 10*3/uL (ref 0.0–0.2)
BASOS: 0 %
EOS (ABSOLUTE): 0.1 10*3/uL (ref 0.0–0.4)
Eos: 1 %
Hematocrit: 40.4 % (ref 37.5–51.0)
Hemoglobin: 13.7 g/dL (ref 13.0–17.7)
IMMATURE GRANS (ABS): 0 10*3/uL (ref 0.0–0.1)
Immature Granulocytes: 0 %
LYMPHS: 16 %
Lymphocytes Absolute: 1.6 10*3/uL (ref 0.7–3.1)
MCH: 30.4 pg (ref 26.6–33.0)
MCHC: 33.9 g/dL (ref 31.5–35.7)
MCV: 90 fL (ref 79–97)
MONOS ABS: 1 10*3/uL — AB (ref 0.1–0.9)
Monocytes: 9 %
NEUTROS ABS: 7.4 10*3/uL — AB (ref 1.4–7.0)
NEUTROS PCT: 74 %
PLATELETS: 188 10*3/uL (ref 150–379)
RBC: 4.5 x10E6/uL (ref 4.14–5.80)
RDW: 13.8 % (ref 12.3–15.4)
WBC: 10.1 10*3/uL (ref 3.4–10.8)

## 2016-09-18 LAB — LIPASE: LIPASE: 27 U/L (ref 13–78)

## 2016-09-18 MED ORDER — IOPAMIDOL (ISOVUE-300) INJECTION 61%
100.0000 mL | Freq: Once | INTRAVENOUS | Status: AC | PRN
  Administered 2016-09-18: 100 mL via INTRAVENOUS

## 2016-09-18 NOTE — Telephone Encounter (Signed)
-----   Message from Kieth BrightlySeeplaputhur G Sankar, MD sent at 09/18/2016  8:00 AM EST ----- Labs are normal. Await CT scan later today

## 2016-09-19 ENCOUNTER — Encounter: Payer: Self-pay | Admitting: General Surgery

## 2016-09-19 ENCOUNTER — Ambulatory Visit (INDEPENDENT_AMBULATORY_CARE_PROVIDER_SITE_OTHER): Payer: Medicare Other | Admitting: General Surgery

## 2016-09-19 VITALS — BP 150/70 | HR 97 | Resp 14 | Ht 69.0 in | Wt 176.0 lb

## 2016-09-19 DIAGNOSIS — R1013 Epigastric pain: Secondary | ICD-10-CM

## 2016-09-19 MED ORDER — AMOXICILLIN-POT CLAVULANATE 875-125 MG PO TABS: 1.0000 | ORAL_TABLET | Freq: Two times a day (BID) | ORAL | 0 refills | Status: AC

## 2016-09-19 NOTE — Patient Instructions (Addendum)
Patient to return in one weeks.  Augmentin was sent to pharmacy.

## 2016-09-19 NOTE — Telephone Encounter (Signed)
F/U appt on 09-19-16

## 2016-09-19 NOTE — Progress Notes (Signed)
Patient ID: Bill Cooper, male   DOB: 11/08/1929, 83 y.o.   MRN: [id number]  Chief Complaint  Patient presents with  . Follow-up    HPI Bill DadBeacham H Buttermore is a 83 y.o. male here today for his follow up from an ct scan done on 09/18/2016. He states he is much better today. pain in  Upper abdomen has not fully resolved but it is intermittent. No more fever. Denies nausea or vomiting, bowel movements are regular. I have reviewed the history of present illness with the patient.  HPI  Past Medical History:  Diagnosis Date  . DVT (deep venous thrombosis) (HCC) 2012   after hip replacement  . GERD (gastroesophageal reflux disease)    rare  . Wears dentures    full upper, partial lower  . Wears hearing aid    bilateral    Past Surgical History:  Procedure Laterality Date  . APPENDECTOMY    . CATARACT EXTRACTION W/PHACO Right 12/28/2014   Procedure: CATARACT EXTRACTION PHACO AND INTRAOCULAR LENS PLACEMENT (IOC);  Surgeon: Lockie Molahadwick Brasington, MD;  [facility];  Service: Ophthalmology;  Laterality: Right;  . CHOLECYSTECTOMY    . COLONOSCOPY  2006  . KNEE ARTHROSCOPY    . TOTAL HIP ARTHROPLASTY Bilateral   . UPPER GI ENDOSCOPY  2006    Family History  Problem Relation Age of Onset  . CAD Father   . Colon cancer Brother   . Liver cancer Brother   . Lung cancer Brother     and colon  . Prostate cancer Brother     Social History Social History  Substance Use Topics  . Smoking status: Never Smoker  . Smokeless tobacco: Never Used  . Alcohol use No    No Known Allergies  Current Outpatient Prescriptions  Medication Sig Dispense Refill  . Multiple Vitamin (MULTIVITAMIN) capsule Take 1 capsule by mouth daily.    Marland Kitchen. omeprazole (PRILOSEC) 20 MG capsule Take 20 mg by mouth as needed.    Marland Kitchen. amoxicillin-clavulanate (AUGMENTIN) 875-125 MG tablet Take 1 tablet by mouth 2 (two) times daily. 14 tablet 0   No current facility-administered medications for this visit.      Review of Systems Review of Systems  Constitutional: Negative.   Respiratory: Negative.     Blood pressure (!) 150/70, pulse 97, resp. rate 14, height 5\' 9"  (1.753 m), weight 176 lb (79.8 kg).  Physical Exam Physical Exam  Constitutional: He appears well-developed and well-nourished.  Abdominal: Soft. Bowel sounds are normal. There is tenderness.      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 This is somewhat unusual. Will plan for Rx with antibiotic and reassess. If no improvement will consider a laparoscopy     Plan    Patient to return in one weeks.  Augmentin 875mg  bid was sent to pharmacy.      This information has been scribed by Dorathy DaftMarsha Hatch RN, BSN,BC.   Jaidin Richison G 09/19/2016, 2:39 PM

## 2016-09-25 ENCOUNTER — Ambulatory Visit (INDEPENDENT_AMBULATORY_CARE_PROVIDER_SITE_OTHER): Payer: Medicare Other | Admitting: General Surgery

## 2016-09-25 ENCOUNTER — Encounter: Payer: Self-pay | Admitting: General Surgery

## 2016-09-25 VITALS — BP 132/74 | HR 76 | Resp 14 | Ht 69.0 in | Wt 178.0 lb

## 2016-09-25 DIAGNOSIS — K5712 Diverticulitis of small intestine without perforation or abscess without bleeding: Secondary | ICD-10-CM

## 2016-09-25 NOTE — Progress Notes (Signed)
Patient ID: Bill Cooper, male   DOB: 06/12/1930, 83 y.o.   MRN: [id number]  Chief Complaint  Patient presents with  . Follow-up    HPI Bill Cooper is a 83 y.o. male.  Here today for a follow up . He states he is feeling better today, just no energy. He states his abdomen does not hurt like it did last week. He has one day of antibiotics left to take. I have reviewed the history of present illness with the patient.  HPI  Past Medical History:  Diagnosis Date  . DVT (deep venous thrombosis) (HCC) 2012   after hip replacement  . GERD (gastroesophageal reflux disease)    rare  . Wears dentures    full upper, partial lower  . Wears hearing aid    bilateral    Past Surgical History:  Procedure Laterality Date  . APPENDECTOMY    . CATARACT EXTRACTION W/PHACO Right 12/28/2014   Procedure: CATARACT EXTRACTION PHACO AND INTRAOCULAR LENS PLACEMENT (IOC);  Surgeon: Lockie Molahadwick Brasington, MD;  [facility];  Service: Ophthalmology;  Laterality: Right;  . CHOLECYSTECTOMY    . COLONOSCOPY  2006  . KNEE ARTHROSCOPY    . TOTAL HIP ARTHROPLASTY Bilateral   . UPPER GI ENDOSCOPY  2006    Family History  Problem Relation Age of Onset  . CAD Father   . Colon cancer Brother   . Liver cancer Brother   . Lung cancer Brother     and colon  . Prostate cancer Brother     Social History Social History  Substance Use Topics  . Smoking status: Never Smoker  . Smokeless tobacco: Never Used  . Alcohol use No    No Known Allergies  Current Outpatient Prescriptions  Medication Sig Dispense Refill  . amoxicillin-clavulanate (AUGMENTIN) 875-125 MG tablet Take 1 tablet by mouth 2 (two) times daily. 14 tablet 0  . Multiple Vitamin (MULTIVITAMIN) capsule Take 1 capsule by mouth daily.    Marland Kitchen. omeprazole (PRILOSEC) 20 MG capsule Take 20 mg by mouth as needed.     No current facility-administered medications for this visit.     Review of Systems Review of Systems   Constitutional: Negative.   Respiratory: Negative.   Cardiovascular: Negative.   Gastrointestinal: Negative for constipation, diarrhea, nausea and vomiting.    Blood pressure 132/74, pulse 76, resp. rate 14, height 5\' 9"  (1.753 m), weight 178 lb (80.7 kg).  Physical Exam Physical Exam  Constitutional: He is oriented to person, place, and time. He appears well-developed and well-nourished.  Cardiovascular: Normal rate and regular rhythm.   Abdominal: Soft. Bowel sounds are normal. There is no tenderness.  Neurological: He is alert and oriented to person, place, and time.  Skin: Skin is warm and dry.  Psychiatric: His behavior is normal.    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 Appears much improved.     Plan       Recheck in 6 weeks. Pt advised to call for any recurrence of abdominal symptoms.    This information has been scribed by Dorathy DaftMarsha Hatch RN, BSN,BC.   SANKAR,SEEPLAPUTHUR G 09/25/2016, 9:34 AM

## 2016-09-25 NOTE — Patient Instructions (Signed)
The patient is aware to call back for any questions or concerns.  

## 2016-10-18 DIAGNOSIS — R079 Chest pain, unspecified: Secondary | ICD-10-CM | POA: Insufficient documentation

## 2016-11-06 ENCOUNTER — Ambulatory Visit (INDEPENDENT_AMBULATORY_CARE_PROVIDER_SITE_OTHER): Payer: Medicare Other | Admitting: General Surgery

## 2016-11-06 ENCOUNTER — Encounter: Payer: Self-pay | Admitting: General Surgery

## 2016-11-06 VITALS — BP 132/72 | HR 78 | Resp 16 | Ht 69.0 in | Wt 177.0 lb

## 2016-11-06 DIAGNOSIS — R1013 Epigastric pain: Secondary | ICD-10-CM

## 2016-11-06 DIAGNOSIS — A09 Infectious gastroenteritis and colitis, unspecified: Secondary | ICD-10-CM | POA: Diagnosis not present

## 2016-11-06 DIAGNOSIS — R197 Diarrhea, unspecified: Secondary | ICD-10-CM

## 2016-11-06 NOTE — Patient Instructions (Addendum)
The patient is aware to call back for any questions or concerns. Send stool for C Diff and culture

## 2016-11-06 NOTE — Progress Notes (Addendum)
Patient ID: Bill Cooper, male   DOB: 10-12-1929, 83 y.o.   MRN: [id number]  Chief Complaint  Patient presents with  . Follow-up    HPI Bill Cooper is a 83 y.o. male.  Here for follow up diverticulitis. He states his stomach stopped hurting since last office visit. He does still have loose watery stools and was worse yesterday. He does admit to increased "gas". I have reviewed the history of present illness with the patient.  HPI  Past Medical History:  Diagnosis Date  . DVT (deep venous thrombosis) (HCC) 2012   after hip replacement  . GERD (gastroesophageal reflux disease)    rare  . Wears dentures    full upper, partial lower  . Wears hearing aid    bilateral    Past Surgical History:  Procedure Laterality Date  . APPENDECTOMY    . CATARACT EXTRACTION W/PHACO Right 12/28/2014   Procedure: CATARACT EXTRACTION PHACO AND INTRAOCULAR LENS PLACEMENT (IOC);  Surgeon: Lockie Mola, MD;  [facility]: [facility];  Service: Ophthalmology;  Laterality: Right;  . CHOLECYSTECTOMY    . COLONOSCOPY  2006  . KNEE ARTHROSCOPY    . TOTAL HIP ARTHROPLASTY Bilateral   . UPPER GI ENDOSCOPY  2006    Family History  Problem Relation Age of Onset  . CAD Father   . Colon cancer Brother   . Liver cancer Brother   . Lung cancer Brother     and colon  . Prostate cancer Brother     Social History Social History  Substance Use Topics  . Smoking status: Never Smoker  . Smokeless tobacco: Never Used  . Alcohol use No    No Known Allergies  Current Outpatient Prescriptions  Medication Sig Dispense Refill  . Multiple Vitamin (MULTIVITAMIN) capsule Take 1 capsule by mouth daily.    Marland Kitchen omeprazole (PRILOSEC) 20 MG capsule Take 20 mg by mouth as needed.     No current facility-administered medications for this visit.     Review of Systems Review of Systems  Constitutional: Negative.   Respiratory: Negative.   Cardiovascular: Negative.     Blood pressure 132/72,  pulse 78, resp. rate 16, height  (1.753 m), weight 177 lb (80.3 kg).  Physical Exam Physical Exam  Constitutional: He is oriented to person, place, and time. He appears well-developed and well-nourished.  HENT:  Mouth/Throat: Oropharynx is clear and moist.  Eyes: Conjunctivae are normal. No scleral icterus.  Neck: Neck supple.  Cardiovascular: Normal rate, regular rhythm and normal heart sounds.   Pulmonary/Chest: Effort normal and breath sounds normal.  Abdominal: Soft. Normal appearance and bowel sounds are normal. There is no tenderness.  Lymphadenopathy:    He has no cervical adenopathy.  Neurological: He is alert and oriented to person, place, and time.  Skin: Skin is warm and dry.  Psychiatric: His behavior is normal.    Data Reviewed  Prior notes, CT scan and labs  Assessment    Abdominal pain resolved. Diarrhea and lack of control at times is new.    Plan    Send stool for C Diff and culture. If stool tests are negative will get GI consult     This information has been scribed by Dorathy Daft RN, BSN,BC.   Latise Dilley G 11/07/2016, 8:52 AM

## 2016-11-14 ENCOUNTER — Telehealth: Payer: Self-pay | Admitting: *Deleted

## 2016-11-14 NOTE — Telephone Encounter (Signed)
Notified patient as instructed, patient pleased. Discussed follow-up appointments, patient agrees  

## 2016-11-14 NOTE — Telephone Encounter (Signed)
-----   Message from Kieth Brightly, MD sent at 11/13/2016  5:36 PM EDT ----- Please inform pt-all stool studies are normal. Follow up in 1 mo or sooner if symptoms of abd pain, N/V  and/or bowel problems recur

## 2016-11-17 LAB — C DIFFICILE, CYTOTOXIN B

## 2016-11-17 LAB — STOOL CULTURE: E COLI SHIGA TOXIN ASSAY: NEGATIVE

## 2016-11-17 LAB — C DIFFICILE TOXINS A+B W/RFLX: C DIFFICILE TOXINS A+B, EIA: NEGATIVE

## 2016-11-18 ENCOUNTER — Ambulatory Visit (INDEPENDENT_AMBULATORY_CARE_PROVIDER_SITE_OTHER): Payer: Medicare Other | Admitting: Podiatry

## 2016-11-18 ENCOUNTER — Encounter: Payer: Self-pay | Admitting: Podiatry

## 2016-11-18 DIAGNOSIS — B351 Tinea unguium: Secondary | ICD-10-CM | POA: Diagnosis not present

## 2016-11-18 DIAGNOSIS — M79676 Pain in unspecified toe(s): Secondary | ICD-10-CM

## 2016-11-18 DIAGNOSIS — M1990 Unspecified osteoarthritis, unspecified site: Secondary | ICD-10-CM | POA: Insufficient documentation

## 2016-11-18 NOTE — Progress Notes (Signed)
   Subjective:    Patient ID: Bill Cooper, male    DOB: 09-09-1929, 83 y.o.   MRN: [id number]  HPI: He presents today with chief complaint of painful elongated toenails.    Review of Systems  Musculoskeletal: Positive for arthralgias.  All other systems reviewed and are negative.      Objective:   Physical Exam: I'll signs are stable he is alert and oriented 3. Pulses are palpable. Tenderness along thick yellow dystrophic mycotic and painful palpation.        Assessment & Plan:  Pain limb secondary to onychomycosis.  Plan: Debrided nails 1 through 5 bilateral.

## 2017-02-17 ENCOUNTER — Encounter: Payer: Self-pay | Admitting: Podiatry

## 2017-02-17 ENCOUNTER — Ambulatory Visit (INDEPENDENT_AMBULATORY_CARE_PROVIDER_SITE_OTHER): Payer: Medicare Other | Admitting: Podiatry

## 2017-02-17 DIAGNOSIS — B351 Tinea unguium: Secondary | ICD-10-CM | POA: Diagnosis not present

## 2017-02-17 DIAGNOSIS — M79676 Pain in unspecified toe(s): Secondary | ICD-10-CM

## 2017-02-17 NOTE — Progress Notes (Signed)
He presents today with his wife with a chief complaint of painful elongated toenails bilaterally.  Objective: Vital signs are stable alert and oriented 3. Pulses are palpable. Toenails are long thick yellow dystrophic onychomycotic. No open lesions or wounds are noted.  Assessment: Pain and limp secondary to onychomycosis.  Plan: Debridement of toenails 1 through 5 bilateral.

## 2017-05-21 ENCOUNTER — Ambulatory Visit (INDEPENDENT_AMBULATORY_CARE_PROVIDER_SITE_OTHER): Payer: Medicare Other | Admitting: Podiatry

## 2017-05-21 ENCOUNTER — Encounter: Payer: Self-pay | Admitting: Podiatry

## 2017-05-21 DIAGNOSIS — B351 Tinea unguium: Secondary | ICD-10-CM

## 2017-05-21 DIAGNOSIS — M79676 Pain in unspecified toe(s): Secondary | ICD-10-CM | POA: Diagnosis not present

## 2017-05-21 NOTE — Progress Notes (Signed)
He presents today she complained of painful toenails bilateral foot.  Objective: Vital signs are stable he is alert and oriented 3. Pulses are palpable. Sensorium is intact. Toenails are long and thick yellow dystrophic local necrotic sharply incurvated.  Assessment: Pain limb secondary to onychomycosis.  Plan: Debridement of toenails 1 through 5 bilateral.

## 2017-08-27 ENCOUNTER — Ambulatory Visit (INDEPENDENT_AMBULATORY_CARE_PROVIDER_SITE_OTHER): Payer: Medicare Other | Admitting: Podiatry

## 2017-08-27 ENCOUNTER — Encounter: Payer: Self-pay | Admitting: Podiatry

## 2017-08-27 DIAGNOSIS — M79676 Pain in unspecified toe(s): Secondary | ICD-10-CM

## 2017-08-27 DIAGNOSIS — B351 Tinea unguium: Secondary | ICD-10-CM | POA: Diagnosis not present

## 2017-08-27 NOTE — Progress Notes (Signed)
Presents today chief complaint of painful elongated toenails.  Objective: Pulses remain palpable nails are sharply incurvated and painful on palpation.  Thick yellow dystrophic clinically mycotic.  Assessment: Ingrown mycotic nails with pain in limb.  Plan: Debridement of toenails 1 through 5 bilateral.  Follow-up with him in 3 months

## 2017-12-03 ENCOUNTER — Ambulatory Visit: Payer: Medicare Other | Admitting: Podiatry

## 2017-12-04 ENCOUNTER — Encounter: Payer: Self-pay | Admitting: Podiatry

## 2017-12-04 ENCOUNTER — Ambulatory Visit (INDEPENDENT_AMBULATORY_CARE_PROVIDER_SITE_OTHER): Payer: Medicare Other | Admitting: Podiatry

## 2017-12-04 DIAGNOSIS — B351 Tinea unguium: Secondary | ICD-10-CM

## 2017-12-04 DIAGNOSIS — M79676 Pain in unspecified toe(s): Secondary | ICD-10-CM | POA: Diagnosis not present

## 2017-12-04 NOTE — Progress Notes (Signed)
Complaint:  Visit Type: Patient returns to my office for continued preventative foot care services. Complaint: Patient states" my nails have grown long and thick and become painful to walk and wear shoes" . The patient presents for preventative foot care services. No changes to ROS  Podiatric Exam: Vascular: dorsalis pedis and posterior tibial pulses are palpable bilateral. Capillary return is immediate. Temperature gradient is WNL. Skin turgor WNL  Sensorium: Normal Semmes Weinstein monofilament test. Normal tactile sensation bilaterally. Nail Exam: Pt has thick disfigured discolored nails with subungual debris noted bilateral entire nail hallux through fifth toenails Ulcer Exam: There is no evidence of ulcer or pre-ulcerative changes or infection. Orthopedic Exam: Muscle tone and strength are WNL. No limitations in general ROM. No crepitus or effusions noted. Foot type and digits show no abnormalities. Bony prominences are unremarkable. Skin: No Porokeratosis. No infection or ulcers  Diagnosis:  Onychomycosis, , Pain in right toe, pain in left toes  Treatment & Plan Procedures and Treatment: Consent by patient was obtained for treatment procedures.   Debridement of mycotic and hypertrophic toenails, 1 through 5 bilateral and clearing of subungual debris. No ulceration, no infection noted.  Return Visit-Office Procedure: Patient instructed to return to the office for a follow up visit 3 months for continued evaluation and treatment.    Dalyn Kjos DPM 

## 2018-03-11 ENCOUNTER — Ambulatory Visit (INDEPENDENT_AMBULATORY_CARE_PROVIDER_SITE_OTHER): Payer: Medicare Other | Admitting: Podiatry

## 2018-03-11 ENCOUNTER — Encounter: Payer: Self-pay | Admitting: Podiatry

## 2018-03-11 DIAGNOSIS — M79676 Pain in unspecified toe(s): Secondary | ICD-10-CM | POA: Diagnosis not present

## 2018-03-11 DIAGNOSIS — B351 Tinea unguium: Secondary | ICD-10-CM | POA: Diagnosis not present

## 2018-03-11 NOTE — Progress Notes (Signed)
He presents today with his wife Harriett Sineancy for a chief complaint of painful elongated toenails.  Objective: Vital signs are stable alert and oriented x3.  Pulses are palpable.  There is no erythema edema cellulitis drainage or odor no open lesions or wounds.  Toenails are long thick yellow dystrophic-like mycotic painful palpation.  Assessment: Pain in limb secondary to onychomycosis.  Plan: Debridement of toenails 1 through 5 bilateral.

## 2018-06-10 ENCOUNTER — Encounter: Payer: Self-pay | Admitting: Podiatry

## 2018-06-10 ENCOUNTER — Ambulatory Visit (INDEPENDENT_AMBULATORY_CARE_PROVIDER_SITE_OTHER): Payer: Medicare Other | Admitting: Podiatry

## 2018-06-10 DIAGNOSIS — M79676 Pain in unspecified toe(s): Secondary | ICD-10-CM

## 2018-06-10 DIAGNOSIS — B351 Tinea unguium: Secondary | ICD-10-CM

## 2018-06-10 NOTE — Progress Notes (Signed)
Presents today with her follow-up of painful toenails.  Objective: Toenails are long thick yellow dystrophic clinically mycotic no open lesions or wounds.  Pulses are palpable.  Assessment: Pain in limb secondary to onychomycosis and pain in limb.  Plan: Debridement of toenails bilateral.  Follow-up with me in 3 months

## 2018-07-16 DIAGNOSIS — R1032 Left lower quadrant pain: Secondary | ICD-10-CM

## 2018-07-16 DIAGNOSIS — R198 Other specified symptoms and signs involving the digestive system and abdomen: Secondary | ICD-10-CM | POA: Insufficient documentation

## 2018-07-16 DIAGNOSIS — R1031 Right lower quadrant pain: Secondary | ICD-10-CM | POA: Insufficient documentation

## 2018-09-09 ENCOUNTER — Encounter: Payer: Self-pay | Admitting: Podiatry

## 2018-09-09 ENCOUNTER — Ambulatory Visit (INDEPENDENT_AMBULATORY_CARE_PROVIDER_SITE_OTHER): Payer: Medicare Other | Admitting: Podiatry

## 2018-09-09 DIAGNOSIS — B351 Tinea unguium: Secondary | ICD-10-CM

## 2018-09-09 DIAGNOSIS — M79676 Pain in unspecified toe(s): Secondary | ICD-10-CM | POA: Diagnosis not present

## 2018-09-09 NOTE — Progress Notes (Signed)
He presents today chief complaint of painfully elongated toenails.  Objective: Toenails are long thick yellow dystrophic onychomycotic pulses are palpable no open lesions or wounds.  Assessment: Pain in limb secondary to onychomycosis.  Plan: Debridement of toenails 1 through 5 bilateral.

## 2018-12-09 ENCOUNTER — Ambulatory Visit: Payer: Medicare Other | Admitting: Podiatry

## 2018-12-14 ENCOUNTER — Other Ambulatory Visit: Payer: Self-pay

## 2018-12-14 ENCOUNTER — Ambulatory Visit (INDEPENDENT_AMBULATORY_CARE_PROVIDER_SITE_OTHER): Payer: Medicare Other | Admitting: Podiatry

## 2018-12-14 ENCOUNTER — Encounter: Payer: Self-pay | Admitting: Podiatry

## 2018-12-14 VITALS — Temp 98.0°F

## 2018-12-14 DIAGNOSIS — M79676 Pain in unspecified toe(s): Secondary | ICD-10-CM

## 2018-12-14 DIAGNOSIS — B351 Tinea unguium: Secondary | ICD-10-CM | POA: Diagnosis not present

## 2018-12-14 NOTE — Progress Notes (Signed)
Who presents today chief complaint of painful elongated toenails.  Objective: Toenails are long thick yellow dystrophic with mycotic pulses are palpable no open lesions or wounds are noted hallux valgus deformities bilateral.  Assessment: Pain limb secondary to onychomycosis.  Plan: Debridement of toenails 1 through 5 bilateral.

## 2019-03-16 ENCOUNTER — Other Ambulatory Visit: Payer: Self-pay

## 2019-03-16 ENCOUNTER — Ambulatory Visit (INDEPENDENT_AMBULATORY_CARE_PROVIDER_SITE_OTHER): Payer: Medicare Other | Admitting: Urology

## 2019-03-16 ENCOUNTER — Encounter: Payer: Self-pay | Admitting: Urology

## 2019-03-16 VITALS — BP 131/87 | HR 81 | Ht 67.0 in | Wt 175.0 lb

## 2019-03-16 DIAGNOSIS — N401 Enlarged prostate with lower urinary tract symptoms: Secondary | ICD-10-CM

## 2019-03-16 DIAGNOSIS — R351 Nocturia: Secondary | ICD-10-CM

## 2019-03-16 LAB — BLADDER SCAN AMB NON-IMAGING: Scan Result: 0

## 2019-03-16 MED ORDER — TROSPIUM CHLORIDE 20 MG PO TABS: ORAL_TABLET | ORAL | 0 refills | Status: DC

## 2019-03-16 NOTE — Progress Notes (Signed)
03/16/2019 3:40 PM   Bill Cooper 09/29/1929 [id number]  Referring provider: Lynnea FerrierKlein, Bert J III, MD [address] [facility] [place] [id number]  Chief Complaint  Patient presents with  . Follow-up    HPI: Bill Cooper is a 85 y.o. male seen at request of Dr. Graciela HusbandsKlein for lower urinary tract symptoms.  I had previously seen him at my [facility] and he underwent PVP in 2009 for BPH with lower urinary tract symptoms.  I last saw him in 2014 and he had no complaints.  He has recently noted recurrent lower urinary tract symptoms including urinary frequency, urgency, weak stream, sensation of incomplete emptying.  His most bothersome symptom is nocturia x5-6.  He voids only small amounts at night estimated at less than a teaspoon.  IPSS completed today was 29/4.  He was given a trial of tamsulosin starting in early June and states this has not significantly helped his symptoms.  No history of sleep apnea.  PMH: Past Medical History:  Diagnosis Date  . DVT (deep venous thrombosis) (HCC) 2012   after hip replacement  . GERD (gastroesophageal reflux disease)    rare  . Wears dentures    full upper, partial lower  . Wears hearing aid    bilateral    Surgical History: Past Surgical History:  Procedure Laterality Date  . APPENDECTOMY    . CATARACT EXTRACTION W/PHACO Right 12/28/2014   Procedure: CATARACT EXTRACTION PHACO AND INTRAOCULAR LENS PLACEMENT (IOC);  Surgeon: Lockie Molahadwick Brasington, MD;  [facility];  Service: Ophthalmology;  Laterality: Right;  . CHOLECYSTECTOMY    . COLONOSCOPY  2006  . KNEE ARTHROSCOPY    . TOTAL HIP ARTHROPLASTY Bilateral   . UPPER GI ENDOSCOPY  2006    Home Medications:  Allergies as of 03/16/2019   No Known Allergies     Medication List       Accurate as of March 16, 2019  3:40 PM. If you have any questions, ask your nurse or doctor.        amoxicillin 500 MG tablet Commonly known  as: AMOXIL TK 4 TS PO 1 HOUR PRIOR TO DENTAL PROCEDURE   aspirin EC 81 MG tablet Take by mouth.   famotidine 20 MG tablet Commonly known as: PEPCID Take by mouth.   latanoprost 0.005 % ophthalmic solution Commonly known as: XALATAN INT 1 GTT IN OU QHS   multivitamin capsule Take 1 capsule by mouth daily.   omeprazole 20 MG capsule Commonly known as: PRILOSEC TAKE 1 CAPSULE(20 MG) BY MOUTH EVERY DAY AS NEEDED   timolol 0.5 % ophthalmic solution Commonly known as: TIMOPTIC INT 1 GTT IN OU QD   triamcinolone cream 0.1 % Commonly known as: KENALOG APP EXT AA BID       Allergies: No Known Allergies  Family History: Family History  Problem Relation Age of Onset  . CAD Father   . Colon cancer Brother   . Liver cancer Brother   . Lung cancer Brother        and colon  . Prostate cancer Brother     Social History:  reports that he has never smoked. He has never used smokeless tobacco. He reports that he does not drink alcohol or use drugs.  ROS: UROLOGY Frequent Urination?: Yes Hard to postpone urination?: No Burning/pain with urination?: No Get up at night to urinate?: No Leakage of urine?: No Urine stream starts and stops?: No Trouble starting stream?:  No Do you have to strain to urinate?: No Blood in urine?: No Urinary tract infection?: No Sexually transmitted disease?: No Injury to kidneys or bladder?: No Painful intercourse?: No Weak stream?: No Erection problems?: Yes Penile pain?: No  Gastrointestinal Nausea?: No Vomiting?: No Indigestion/heartburn?: No Diarrhea?: No Constipation?: No  Constitutional Fever: No Night sweats?: No Weight loss?: No Fatigue?: No  Skin Skin rash/lesions?: No Itching?: No  Eyes Blurred vision?: No Double vision?: No  Ears/Nose/Throat Sore throat?: No Sinus problems?: No  Hematologic/Lymphatic Swollen glands?: No Easy bruising?: No  Cardiovascular Leg swelling?: No Chest pain?: No  Respiratory  Cough?: No Shortness of breath?: No  Endocrine Excessive thirst?: No  Musculoskeletal Back pain?: No Joint pain?: No  Neurological Headaches?: No Dizziness?: No  Psychologic Depression?: No Anxiety?: No  Physical Exam: BP 131/87   Pulse 81   Ht 5\' 7"  (1.702 m)   Wt 175 lb (79.4 kg)   BMI 27.41 kg/m   Constitutional:  Alert and oriented, No acute distress. HEENT: [place] AT, moist mucus membranes.  Trachea midline, no masses. Cardiovascular: No clubbing, cyanosis, or edema. Respiratory: Normal respiratory effort, no increased work of breathing. Skin: No rashes, bruises or suspicious lesions. Neurologic: Grossly intact, no focal deficits, moving all 4 extremities. Psychiatric: Normal mood and affect.   Assessment & Plan:    - BPH with lower urinary tract symptoms No improvement with a recent trial of tamsulosin.  His most bothersome symptom is nocturia x5-6 with small voided volumes at night.  PVR by bladder scan was 0 mL.  Initially recommended a trial of a short acting anticholinergic at bedtime.  Based on age would recommend trospium 20 mg 1 hour prior to bedtime.  He will call back in 30 days regarding efficacy of this medication.  If no improvement will give a trial of Myrbetriq.   [name], [place] [address] [place], [place] [address] [phone]

## 2019-03-17 ENCOUNTER — Ambulatory Visit (INDEPENDENT_AMBULATORY_CARE_PROVIDER_SITE_OTHER): Payer: Medicare Other | Admitting: Podiatry

## 2019-03-17 ENCOUNTER — Encounter: Payer: Self-pay | Admitting: Podiatry

## 2019-03-17 VITALS — Temp 97.6°F

## 2019-03-17 DIAGNOSIS — M79676 Pain in unspecified toe(s): Secondary | ICD-10-CM

## 2019-03-17 DIAGNOSIS — B351 Tinea unguium: Secondary | ICD-10-CM

## 2019-03-17 NOTE — Progress Notes (Signed)
He presents today chief complaint of painfully elongated toenails bilateral.  Objective: Pulses are palpable.  Neurologic sensorium is intact toenails are long thick yellow dystrophic-like mycotic painful palpation as well as debridement.  Assessment: Pain in limb secondary to onychomycosis.  Plan: Debridement of toenails 1 through 5 bilateral.

## 2019-05-04 ENCOUNTER — Ambulatory Visit: Payer: Medicare Other | Admitting: Urology

## 2019-05-05 ENCOUNTER — Ambulatory Visit (INDEPENDENT_AMBULATORY_CARE_PROVIDER_SITE_OTHER): Payer: Medicare Other | Admitting: Urology

## 2019-05-05 ENCOUNTER — Other Ambulatory Visit: Payer: Self-pay

## 2019-05-05 ENCOUNTER — Encounter: Payer: Self-pay | Admitting: Urology

## 2019-05-05 VITALS — BP 117/73 | HR 62 | Ht 67.0 in | Wt 170.4 lb

## 2019-05-05 DIAGNOSIS — R351 Nocturia: Secondary | ICD-10-CM

## 2019-05-05 DIAGNOSIS — N401 Enlarged prostate with lower urinary tract symptoms: Secondary | ICD-10-CM | POA: Diagnosis not present

## 2019-05-05 NOTE — Progress Notes (Signed)
05/05/2019 11:15 AM   [name] [birth year] [id number]  Referring provider: Lynnea Ferrier, MD [address] [facility] [place],  [place] [address]  Chief Complaint  Patient presents with  . Benign Prostatic Hypertrophy    HPI: [name] presents for follow-up of nocturia.  He was given a trial of immediate release trospium at bedtime.  He states his nocturia decreased from 5-6 times per night down to 2-3.    PMH: Past Medical History:  Diagnosis Date  . DVT (deep venous thrombosis) (HCC) 2012   after hip replacement  . GERD (gastroesophageal reflux disease)    rare  . Wears dentures    full upper, partial lower  . Wears hearing aid    bilateral    Surgical History: Past Surgical History:  Procedure Laterality Date  . APPENDECTOMY    . CATARACT EXTRACTION W/PHACO Right 12/28/2014   Procedure: CATARACT EXTRACTION PHACO AND INTRAOCULAR LENS PLACEMENT (IOC);  Surgeon: Lockie Mola, MD;  Location: [facility];  Service: Ophthalmology;  Laterality: Right;  . CHOLECYSTECTOMY    . COLONOSCOPY  2006  . KNEE ARTHROSCOPY    . TOTAL HIP ARTHROPLASTY Bilateral   . UPPER GI ENDOSCOPY  2006    Home Medications:  Allergies as of 05/05/2019   No Known Allergies     Medication List       Accurate as of May 05, 2019 11:15 AM. If you have any questions, ask your nurse or doctor.        amoxicillin 500 MG tablet Commonly known as: AMOXIL TK 4 TS PO 1 HOUR PRIOR TO DENTAL PROCEDURE   aspirin EC 81 MG tablet Take by mouth.   famotidine 20 MG tablet Commonly known as: PEPCID Take by mouth.   latanoprost 0.005 % ophthalmic solution Commonly known as: XALATAN INT 1 GTT IN OU QHS   multivitamin capsule Take 1 capsule by mouth daily.   omeprazole 20 MG capsule Commonly known as: PRILOSEC TAKE 1 CAPSULE(20 MG) BY MOUTH EVERY DAY AS NEEDED   timolol 0.5 % ophthalmic solution Commonly known as: TIMOPTIC INT 1 GTT IN OU QD  triamcinolone cream 0.1 % Commonly known as: KENALOG APP EXT AA BID   trospium 20 MG tablet Commonly known as: SANCTURA 1 tablet 1 hour prior to bedtime       Allergies: No Known Allergies  Family History: Family History  Problem Relation Age of Onset  . CAD Father   . Colon cancer Brother   . Liver cancer Brother   . Lung cancer Brother        and colon  . Prostate cancer Brother     Social History:  reports that he has never smoked. He has never used smokeless tobacco. He reports that he does not drink alcohol or use drugs.  ROS: UROLOGY Frequent Urination?: No Hard to postpone urination?: Yes Burning/pain with urination?: No Get up at night to urinate?: Yes Leakage of urine?: No Urine stream starts and stops?: No Trouble starting stream?: No Do you have to strain to urinate?: No Blood in urine?: No Urinary tract infection?: No Sexually transmitted disease?: No Injury to kidneys or bladder?: No Painful intercourse?: No Weak stream?: No Erection problems?: Yes Penile pain?: No  Gastrointestinal Nausea?: No Vomiting?: No Indigestion/heartburn?: No Diarrhea?: No Constipation?: No  Constitutional Fever: No Night sweats?: No Weight loss?: No Fatigue?: No  Skin Skin rash/lesions?: No Itching?: No  Eyes Blurred vision?: No Double vision?: No  Ears/Nose/Throat Sore throat?: No Sinus problems?: No  Hematologic/Lymphatic Swollen glands?: No Easy bruising?: No  Cardiovascular Leg swelling?: No Chest pain?: No  Respiratory Cough?: No Shortness of breath?: No  Endocrine Excessive thirst?: No  Musculoskeletal Back pain?: No Joint pain?: No  Neurological Headaches?: No Dizziness?: No  Psychologic Depression?: No Anxiety?: No  Physical Exam: BP 117/73 (BP Location: Left Arm, Patient Position: Sitting, Cuff Size: Normal)   Pulse 62   Ht 5\' 7"  (1.702 m)   Wt 170 lb 6.4 oz (77.3 kg)   BMI 26.69 kg/m   Constitutional:  Alert and  oriented, No acute distress.  Assessment & Plan:    - Nocturia No previous improvement with tamsulosin.  His voided volumes are small.  He did have significant improvement with trospium.  He did want to see if he could improve on getting up 2-3 times per night.  He was given samples of Myrbetriq 25 mg.  Call back in 4 weeks regarding medication efficacy.  If no significant improvement would resume nightly trospium.   [name], [place] [address], [place], [place] [address] [phone]

## 2019-05-09 ENCOUNTER — Encounter: Payer: Self-pay | Admitting: Urology

## 2019-05-22 ENCOUNTER — Other Ambulatory Visit: Payer: Self-pay

## 2019-05-22 ENCOUNTER — Emergency Department: Payer: Medicare Other

## 2019-05-22 ENCOUNTER — Observation Stay
Admission: EM | Admit: 2019-05-22 | Discharge: 2019-05-24 | Disposition: A | Discharge status: Home / Self Care | Payer: Medicare Other | Attending: Internal Medicine | Admitting: Internal Medicine

## 2019-05-22 ENCOUNTER — Encounter: Payer: Self-pay | Admitting: Emergency Medicine

## 2019-05-22 DIAGNOSIS — Z7982 Long term (current) use of aspirin: Secondary | ICD-10-CM | POA: Insufficient documentation

## 2019-05-22 DIAGNOSIS — Z20828 Contact with and (suspected) exposure to other viral communicable diseases: Secondary | ICD-10-CM | POA: Diagnosis not present

## 2019-05-22 DIAGNOSIS — R338 Other retention of urine: Secondary | ICD-10-CM | POA: Insufficient documentation

## 2019-05-22 DIAGNOSIS — Z96643 Presence of artificial hip joint, bilateral: Secondary | ICD-10-CM | POA: Insufficient documentation

## 2019-05-22 DIAGNOSIS — K219 Gastro-esophageal reflux disease without esophagitis: Secondary | ICD-10-CM | POA: Diagnosis not present

## 2019-05-22 DIAGNOSIS — N401 Enlarged prostate with lower urinary tract symptoms: Secondary | ICD-10-CM | POA: Diagnosis not present

## 2019-05-22 DIAGNOSIS — R0789 Other chest pain: Principal | ICD-10-CM | POA: Insufficient documentation

## 2019-05-22 DIAGNOSIS — I340 Nonrheumatic mitral (valve) insufficiency: Secondary | ICD-10-CM | POA: Insufficient documentation

## 2019-05-22 DIAGNOSIS — H409 Unspecified glaucoma: Secondary | ICD-10-CM | POA: Diagnosis not present

## 2019-05-22 DIAGNOSIS — R079 Chest pain, unspecified: Secondary | ICD-10-CM | POA: Diagnosis present

## 2019-05-22 DIAGNOSIS — I959 Hypotension, unspecified: Secondary | ICD-10-CM | POA: Insufficient documentation

## 2019-05-22 DIAGNOSIS — Z79899 Other long term (current) drug therapy: Secondary | ICD-10-CM | POA: Insufficient documentation

## 2019-05-22 DIAGNOSIS — Z66 Do not resuscitate: Secondary | ICD-10-CM | POA: Insufficient documentation

## 2019-05-22 DIAGNOSIS — Z86718 Personal history of other venous thrombosis and embolism: Secondary | ICD-10-CM | POA: Insufficient documentation

## 2019-05-22 DIAGNOSIS — R55 Syncope and collapse: Secondary | ICD-10-CM | POA: Insufficient documentation

## 2019-05-22 DIAGNOSIS — R739 Hyperglycemia, unspecified: Secondary | ICD-10-CM | POA: Diagnosis not present

## 2019-05-22 LAB — CBC
HCT: 41.3 % (ref 39.0–52.0)
Hemoglobin: 13.8 g/dL (ref 13.0–17.0)
MCH: 31 pg (ref 26.0–34.0)
MCHC: 33.4 g/dL (ref 30.0–36.0)
MCV: 92.8 fL (ref 80.0–100.0)
Platelets: 186 10*3/uL (ref 150–400)
RBC: 4.45 MIL/uL (ref 4.22–5.81)
RDW: 12.3 % (ref 11.5–15.5)
WBC: 10.7 10*3/uL — ABNORMAL HIGH (ref 4.0–10.5)
nRBC: 0 % (ref 0.0–0.2)

## 2019-05-22 LAB — URINALYSIS, ROUTINE W REFLEX MICROSCOPIC
Bilirubin Urine: NEGATIVE
Glucose, UA: NEGATIVE mg/dL
Hgb urine dipstick: NEGATIVE
Ketones, ur: NEGATIVE mg/dL
Leukocytes,Ua: NEGATIVE
Nitrite: NEGATIVE
Protein, ur: NEGATIVE mg/dL
Specific Gravity, Urine: 1.014 (ref 1.005–1.030)
pH: 7 (ref 5.0–8.0)

## 2019-05-22 LAB — TROPONIN I (HIGH SENSITIVITY)
Troponin I (High Sensitivity): 7 ng/L (ref <18)
Troponin I (High Sensitivity): 7 ng/L (ref <18)

## 2019-05-22 LAB — BASIC METABOLIC PANEL
Anion gap: 11 (ref 5–15)
BUN: 13 mg/dL (ref 8–23)
CO2: 24 mmol/L (ref 22–32)
Calcium: 8.7 mg/dL — ABNORMAL LOW (ref 8.9–10.3)
Chloride: 104 mmol/L (ref 98–111)
Creatinine, Ser: 0.74 mg/dL (ref 0.61–1.24)
GFR calc Af Amer: >60 mL/min (ref >60)
GFR calc non Af Amer: >60 mL/min (ref >60)
Glucose, Bld: 158 mg/dL — ABNORMAL HIGH (ref 70–99)
Potassium: 4.1 mmol/L (ref 3.5–5.1)
Sodium: 139 mmol/L (ref 135–145)

## 2019-05-22 LAB — SARS CORONAVIRUS 2 (TAT 6-24 HRS): SARS Coronavirus 2: NEGATIVE

## 2019-05-22 LAB — GLUCOSE, CAPILLARY: Glucose-Capillary: 146 mg/dL — ABNORMAL HIGH (ref 70–99)

## 2019-05-22 LAB — TSH: TSH: 0.812 u[IU]/mL (ref 0.350–4.500)

## 2019-05-22 MED ORDER — MORPHINE SULFATE (PF) 2 MG/ML IV SOLN
2.0000 mg | Freq: Once | INTRAVENOUS | Status: AC
  Administered 2019-05-22: 06:00:00 2 mg via INTRAVENOUS

## 2019-05-22 MED ORDER — LIDOCAINE VISCOUS HCL 2 % MT SOLN: 15.0000 mL | Freq: Once | OROMUCOSAL | Status: DC

## 2019-05-22 MED ORDER — ONDANSETRON HCL 4 MG/2ML IJ SOLN
INTRAMUSCULAR | Status: AC
  Administered 2019-05-22: 06:00:00 4 mg via INTRAVENOUS
  Filled 2019-05-22: qty 2

## 2019-05-22 MED ORDER — ALUM & MAG HYDROXIDE-SIMETH 200-200-20 MG/5ML PO SUSP
30.0000 mL | Freq: Once | ORAL | Status: AC
  Administered 2019-05-22: 23:00:00 30 mL via ORAL
  Filled 2019-05-22: qty 30

## 2019-05-22 MED ORDER — LIDOCAINE VISCOUS HCL 2 % MT SOLN
15.0000 mL | Freq: Once | OROMUCOSAL | Status: DC
  Filled 2019-05-22: qty 15

## 2019-05-22 MED ORDER — PANTOPRAZOLE SODIUM 40 MG IV SOLR
40.0000 mg | Freq: Two times a day (BID) | INTRAVENOUS | Status: DC
  Administered 2019-05-22 – 2019-05-24 (×5): 40 mg via INTRAVENOUS
  Filled 2019-05-22 (×5): qty 40

## 2019-05-22 MED ORDER — SODIUM CHLORIDE 0.9 % IV SOLN
INTRAVENOUS | Status: DC
  Administered 2019-05-22 – 2019-05-24 (×4): via INTRAVENOUS

## 2019-05-22 MED ORDER — ONDANSETRON HCL 4 MG PO TABS: 4.0000 mg | ORAL_TABLET | Freq: Four times a day (QID) | ORAL | Status: DC | PRN

## 2019-05-22 MED ORDER — ASPIRIN EC 81 MG PO TBEC
81.0000 mg | DELAYED_RELEASE_TABLET | Freq: Every day | ORAL | Status: DC
  Administered 2019-05-23 – 2019-05-24 (×2): 81 mg via ORAL
  Filled 2019-05-22 (×2): qty 1

## 2019-05-22 MED ORDER — ACETAMINOPHEN 650 MG RE SUPP: 650.0000 mg | Freq: Four times a day (QID) | RECTAL | Status: DC | PRN

## 2019-05-22 MED ORDER — ACETAMINOPHEN 325 MG PO TABS
650.0000 mg | ORAL_TABLET | Freq: Four times a day (QID) | ORAL | Status: DC | PRN
  Administered 2019-05-23: 650 mg via ORAL
  Filled 2019-05-22: qty 2

## 2019-05-22 MED ORDER — ONDANSETRON HCL 4 MG/2ML IJ SOLN
4.0000 mg | Freq: Once | INTRAMUSCULAR | Status: AC
  Administered 2019-05-22: 06:00:00 4 mg via INTRAVENOUS

## 2019-05-22 MED ORDER — ASPIRIN 81 MG PO CHEW
324.0000 mg | CHEWABLE_TABLET | Freq: Once | ORAL | Status: AC
  Administered 2019-05-22: 09:00:00 324 mg via ORAL
  Filled 2019-05-22: qty 4

## 2019-05-22 MED ORDER — LIDOCAINE VISCOUS HCL 2 % MT SOLN
15.0000 mL | Freq: Once | OROMUCOSAL | Status: AC
  Administered 2019-05-22: 15 mL via ORAL
  Filled 2019-05-22: qty 15

## 2019-05-22 MED ORDER — MORPHINE SULFATE (PF) 4 MG/ML IV SOLN
4.0000 mg | Freq: Once | INTRAVENOUS | Status: AC
  Administered 2019-05-22: 07:00:00 4 mg via INTRAVENOUS
  Filled 2019-05-22: qty 1

## 2019-05-22 MED ORDER — MORPHINE SULFATE (PF) 2 MG/ML IV SOLN
2.0000 mg | INTRAVENOUS | Status: DC | PRN
  Administered 2019-05-22 – 2019-05-23 (×3): 2 mg via INTRAVENOUS
  Filled 2019-05-22 (×3): qty 1

## 2019-05-22 MED ORDER — IOHEXOL 350 MG/ML SOLN
100.0000 mL | Freq: Once | INTRAVENOUS | Status: AC | PRN
  Administered 2019-05-22: 07:00:00 100 mL via INTRAVENOUS

## 2019-05-22 MED ORDER — ENOXAPARIN SODIUM 40 MG/0.4ML ~~LOC~~ SOLN
40.0000 mg | SUBCUTANEOUS | Status: DC
  Administered 2019-05-22 – 2019-05-23 (×2): 40 mg via SUBCUTANEOUS
  Filled 2019-05-22 (×2): qty 0.4

## 2019-05-22 MED ORDER — MORPHINE SULFATE (PF) 2 MG/ML IV SOLN
INTRAVENOUS | Status: AC
  Administered 2019-05-22: 06:00:00 2 mg via INTRAVENOUS
  Filled 2019-05-22: qty 1

## 2019-05-22 MED ORDER — ONDANSETRON HCL 4 MG/2ML IJ SOLN: 4.0000 mg | Freq: Four times a day (QID) | INTRAMUSCULAR | Status: DC | PRN

## 2019-05-22 NOTE — Consult Note (Signed)
Cardiology Consultation Note    Patient ID: Bill Cooper, MRN: [id number], DOB/AGE: [birth year] 83 y.o. Admit date: 05/22/2019   Date of Consult: 05/22/2019 Primary Physician: Lynnea Ferrier, MD Primary Cardiologist: Dr. Darrold Junker  Chief Complaint: chest paini Reason for Consultation: chest pain/syncope Requesting MD: Dr. Auburn Bilberry  HPI: Bill Cooper is a 85 y.o. male with history of DVT, IVC filter placement, who was brought to the ER after complaining to his wife that he had severe chest pain.  Pain was reproduced by deep breathing and palpation.  He presented to the ER where he had a sensation of feeling hot and then had an apparent syncopal episode while being wheeled in the emergency room in a wheelchair.  He was also nauseated at the time.  He lost bladder control.  He had a palpable pulse.  EKG on presentation prior to the event showed sinus rhythm at a rate of 60.  Postevent had no change in his electrocardiogram.  Cardiac high-sensitivity troponin x2 was normal.  Chest x-ray showed possible pulmonary congestion without pulmonary edema.  Chest CT revealed no acute cardiopulmonary disease no pulmonary embolism.  Abdominal pelvic CT revealed no acute findings.  Hemoglobin hematocrit were normal white count was upper limits of normal renal function was normal.  Patient still has severe chest pain when lightly pressing on his chest or breathing deeply.  Past Medical History:  Diagnosis Date  . DVT (deep venous thrombosis) (HCC) 2012   after hip replacement  . GERD (gastroesophageal reflux disease)    rare  . Wears dentures    full upper, partial lower  . Wears hearing aid    bilateral      Surgical History:  Past Surgical History:  Procedure Laterality Date  . APPENDECTOMY    . CATARACT EXTRACTION W/PHACO Right 12/28/2014   Procedure: CATARACT EXTRACTION PHACO AND INTRAOCULAR LENS PLACEMENT (IOC);  Surgeon: Lockie Mola, MD;  Location: [facility];   Service: Ophthalmology;  Laterality: Right;  . CHOLECYSTECTOMY    . COLONOSCOPY  2006  . KNEE ARTHROSCOPY    . TOTAL HIP ARTHROPLASTY Bilateral   . UPPER GI ENDOSCOPY  2006     Home Meds: Prior to Admission medications   Medication Sig Start Date End Date Taking? Authorizing Provider  latanoprost (XALATAN) 0.005 % ophthalmic solution INT 1 GTT IN OU QHS 02/19/18  Yes [provider]  Multiple Vitamin (MULTIVITAMIN) capsule Take 1 capsule by mouth daily.   Yes [provider]  omeprazole (PRILOSEC) 20 MG capsule TAKE 1 CAPSULE(20 MG) BY MOUTH EVERY DAY AS NEEDED 11/27/16  Yes [provider]  timolol (TIMOPTIC) 0.5 % ophthalmic solution INT 1 GTT IN OU QD 05/11/18  Yes [provider]  trospium (SANCTURA) 20 MG tablet 1 tablet 1 hour prior to bedtime 03/16/19  Yes Stoioff, Verna Czech, MD    Inpatient Medications:     Allergies: No Known Allergies  Social History   Socioeconomic History  . Marital status: Married    Spouse name: Not on file  . Number of children: Not on file  . Years of education: Not on file  . Highest education level: Not on file  Occupational History  . Not on file  Social Needs  . Financial resource strain: Not on file  . Food insecurity    Worry: Not on file    Inability: Not on file  . Transportation needs    Medical: Not on file  Non-medical: Not on file  Tobacco Use  . Smoking status: Never Smoker  . Smokeless tobacco: Never Used  Substance and Sexual Activity  . Alcohol use: No    Alcohol/week: 0.0 standard drinks  . Drug use: No  . Sexual activity: Not on file  Lifestyle  . Physical activity    Days per week: Not on file    Minutes per session: Not on file  . Stress: Not on file  Relationships  . Social Musician on phone: Not on file    Gets together: Not on file    Attends religious service: Not on file    Active member of club or organization: Not on file    Attends meetings of clubs or  organizations: Not on file    Relationship status: Not on file  . Intimate partner violence    Fear of current or ex partner: Not on file    Emotionally abused: Not on file    Physically abused: Not on file    Forced sexual activity: Not on file  Other Topics Concern  . Not on file  Social History Narrative  . Not on file     Family History  Problem Relation Age of Onset  . CAD Father   . Colon cancer Brother   . Liver cancer Brother   . Lung cancer Brother        and colon  . Prostate cancer Brother      Review of Systems: A 12-system review of systems was performed and is negative except as noted in the HPI.  Labs: No results for input(s): CKTOTAL, CKMB, TROPONINI in the last 72 hours. Lab Results  Component Value Date   WBC 10.7 (H) 05/22/2019   HGB 13.8 05/22/2019   HCT 41.3 05/22/2019   MCV 92.8 05/22/2019   PLT 186 05/22/2019    Recent Labs  Lab 05/22/19 0554  NA 139  K 4.1  CL 104  CO2 24  BUN 13  CREATININE 0.74  CALCIUM 8.7*  GLUCOSE 158*   No results found for: CHOL, HDL, LDLCALC, TRIG No results found for: DDIMER  Radiology/Studies:  Ct Angio Chest Pe W And/or Wo Contrast  Result Date: 05/22/2019 CLINICAL DATA:  Shortness of breath. Syncopal episode. Acute generalized abdominal pain. Evaluate for pulmonary embolism. EXAM: CT ANGIOGRAPHY CHEST CT ABDOMEN AND PELVIS WITH CONTRAST TECHNIQUE: Multidetector CT imaging of the chest was performed using the standard protocol during bolus administration of intravenous contrast. Multiplanar CT image reconstructions and MIPs were obtained to evaluate the vascular anatomy. Multidetector CT imaging of the abdomen and pelvis was performed using the standard protocol during bolus administration of intravenous contrast. CONTRAST:  OMNIPAQUE IOHEXOL 350 MG/ML SOLN COMPARISON:  Chest CT-03/20/2012; CT abdomen pelvis-09/18/2016 FINDINGS: CTA CHEST FINDINGS Vascular Findings: There is adequate opacification of the  pulmonary arterial system with the main pulmonary artery measuring 337 Hounsfield units. There are no discrete filling defects within the pulmonary arterial tree to suggest pulmonary embolism. Normal caliber the main pulmonary artery. Cardiomegaly. No pericardial effusion. Coronary artery calcifications. No evidence of thoracic aortic aneurysm or dissection. Conventional configuration of the aortic arch. The branch vessels of the aortic arch appear patent throughout their imaged courses. There is a minimal amount of atherosclerotic plaque within the descending thoracic aorta, not resulting in a hemodynamically significant stenosis. Review of the MIP images confirms the above findings. ---------------------------------------------------------------------------------- Nonvascular Findings: Mediastinum/Lymph Nodes: No bulky mediastinal, hilar or axillary lymphadenopathy. Lungs/Pleura: Minimal  dependent subpleural ground-glass atelectasis. Ill-defined ground-glass is also seen within the nondependent portion of the right upper lobe (image 29, series 7). No discrete focal airspace opacities. No air bronchograms. No pleural effusion or pneumothorax. The central pulmonary airways appear widely patent. No discrete pulmonary nodules given limitation of the examination. Musculoskeletal: No acute or aggressive osseous abnormalities. Mild (approximately 25%) compression deformity involving the superior endplate of the T11 vertebral body, similar to abdominal CT performed in 2018. Regional soft tissues appear normal. The thyroid gland appears atrophic but without discrete nodule. _________________________________________________________ CT ABDOMEN and PELVIS FINDINGS Hepatobiliary: Normal hepatic contour. No discrete hepatic lesions. Post cholecystectomy. Similar appearing pneumobilia compatible with prior biliary sphincterotomy. No ascites. Pancreas: Normal appearance of the pancreas. Spleen: Normal appearance of the spleen.  Adrenals/Urinary Tract: There is symmetric enhancement and excretion of the bilateral kidneys. There is an approximately 1.3 cm hypoattenuating cyst arising from the anterior inferior aspect of the right kidney (image 46, series 3). Note is made of several left-sided parapelvic cysts. No definite renal stones this postcontrast examination. No evidence of urinary obstruction. Normal appearance of the bilateral adrenal glands. Normal appearance of the urinary bladder given degree distention however evaluation is degraded secondary streak artifact from the patient's left total hip prosthesis. Stomach/Bowel: Moderate colonic stool burden without evidence of enteric obstruction. Ingested enteric contrast extends to the level of the cecum and ascending colon. The appendix is not visualized compatible provided operative history. Normal appearance of the terminal ileum. Note is made of a prominent jejunal diverticuli within the left mid hemiabdomen which measures approximately 5.3 x 4.4 cm but is without associated wall thickening or adjacent mesenteric stranding, similar to the 09/2016 examination and currently of doubtful clinical concern. Note is also made of a duodenal diverticulum, also without wall thickening or mesenteric stranding (coronal image 49, series 7). No discrete areas of bowel wall thickening. No pneumoperitoneum, pneumatosis or portal venous gas. Vascular/Lymphatic: Atherosclerotic plaque within a normal caliber abdominal aorta. The major branch vessels of the abdominal aorta appear patent on this non CTA examination. No bulky retroperitoneal, mesenteric, pelvic or inguinal lymph adenopathy. Reproductive: Dystrophic calcifications are seen with the prostate gland. No free fluid the pelvic cul-de-sac. Other: Tiny mesenteric fat containing periumbilical hernia. Musculoskeletal: No acute or aggressive osseous abnormalities. Mild (approximately 5%) compression deformity of the T11 vertebral body, similar to  abdominal CT performed in 2018. Mild-to-moderate multilevel lumbar spine DDD, worse at L5-S1 with disc space height loss, endplate irregularity and sclerosis. Post left total hip replacement. Avascular necrosis of the right femoral head with associated mild secondary degenerative change but without articular surface collapse. Review of the MIP images confirms the above findings. IMPRESSION: Chest CTA impression: 1. No acute cardiopulmonary disease. Specifically, no evidence of pulmonary embolism. 2. Cardiomegaly. 3. Coronary artery calcifications. Aortic Atherosclerosis (ICD10-I70.0). Abdomen and pelvic CT impression: 1. No acute findings within the abdomen or pelvis. 2. Redemonstrated duodenal and jejunal diverticuli without associated wall thickening or mesenteric stranding, both of which are of doubtful clinical concern. 3. Post appendectomy and cholecystectomy. 4. Avascular necrosis of the right femoral head with associated mild degenerative change but without articular surface collapse. 5.  Aortic Atherosclerosis (ICD10-I70.0). Electronically Signed   By: Simonne Come M.D.   On: 05/22/2019 07:38   Ct Abdomen Pelvis W Contrast  Result Date: 05/22/2019 CLINICAL DATA:  Shortness of breath. Syncopal episode. Acute generalized abdominal pain. Evaluate for pulmonary embolism. EXAM: CT ANGIOGRAPHY CHEST CT ABDOMEN AND PELVIS WITH CONTRAST TECHNIQUE: Multidetector CT imaging  of the chest was performed using the standard protocol during bolus administration of intravenous contrast. Multiplanar CT image reconstructions and MIPs were obtained to evaluate the vascular anatomy. Multidetector CT imaging of the abdomen and pelvis was performed using the standard protocol during bolus administration of intravenous contrast. CONTRAST:  OMNIPAQUE IOHEXOL 350 MG/ML SOLN COMPARISON:  Chest CT-03/20/2012; CT abdomen pelvis-09/18/2016 FINDINGS: CTA CHEST FINDINGS Vascular Findings: There is adequate opacification of the  pulmonary arterial system with the main pulmonary artery measuring 337 Hounsfield units. There are no discrete filling defects within the pulmonary arterial tree to suggest pulmonary embolism. Normal caliber the main pulmonary artery. Cardiomegaly. No pericardial effusion. Coronary artery calcifications. No evidence of thoracic aortic aneurysm or dissection. Conventional configuration of the aortic arch. The branch vessels of the aortic arch appear patent throughout their imaged courses. There is a minimal amount of atherosclerotic plaque within the descending thoracic aorta, not resulting in a hemodynamically significant stenosis. Review of the MIP images confirms the above findings. ---------------------------------------------------------------------------------- Nonvascular Findings: Mediastinum/Lymph Nodes: No bulky mediastinal, hilar or axillary lymphadenopathy. Lungs/Pleura: Minimal dependent subpleural ground-glass atelectasis. Ill-defined ground-glass is also seen within the nondependent portion of the right upper lobe (image 29, series 7). No discrete focal airspace opacities. No air bronchograms. No pleural effusion or pneumothorax. The central pulmonary airways appear widely patent. No discrete pulmonary nodules given limitation of the examination. Musculoskeletal: No acute or aggressive osseous abnormalities. Mild (approximately 25%) compression deformity involving the superior endplate of the T11 vertebral body, similar to abdominal CT performed in 2018. Regional soft tissues appear normal. The thyroid gland appears atrophic but without discrete nodule. _________________________________________________________ CT ABDOMEN and PELVIS FINDINGS Hepatobiliary: Normal hepatic contour. No discrete hepatic lesions. Post cholecystectomy. Similar appearing pneumobilia compatible with prior biliary sphincterotomy. No ascites. Pancreas: Normal appearance of the pancreas. Spleen: Normal appearance of the spleen.  Adrenals/Urinary Tract: There is symmetric enhancement and excretion of the bilateral kidneys. There is an approximately 1.3 cm hypoattenuating cyst arising from the anterior inferior aspect of the right kidney (image 46, series 3). Note is made of several left-sided parapelvic cysts. No definite renal stones this postcontrast examination. No evidence of urinary obstruction. Normal appearance of the bilateral adrenal glands. Normal appearance of the urinary bladder given degree distention however evaluation is degraded secondary streak artifact from the patient's left total hip prosthesis. Stomach/Bowel: Moderate colonic stool burden without evidence of enteric obstruction. Ingested enteric contrast extends to the level of the cecum and ascending colon. The appendix is not visualized compatible provided operative history. Normal appearance of the terminal ileum. Note is made of a prominent jejunal diverticuli within the left mid hemiabdomen which measures approximately 5.3 x 4.4 cm but is without associated wall thickening or adjacent mesenteric stranding, similar to the 09/2016 examination and currently of doubtful clinical concern. Note is also made of a duodenal diverticulum, also without wall thickening or mesenteric stranding (coronal image 49, series 7). No discrete areas of bowel wall thickening. No pneumoperitoneum, pneumatosis or portal venous gas. Vascular/Lymphatic: Atherosclerotic plaque within a normal caliber abdominal aorta. The major branch vessels of the abdominal aorta appear patent on this non CTA examination. No bulky retroperitoneal, mesenteric, pelvic or inguinal lymph adenopathy. Reproductive: Dystrophic calcifications are seen with the prostate gland. No free fluid the pelvic cul-de-sac. Other: Tiny mesenteric fat containing periumbilical hernia. Musculoskeletal: No acute or aggressive osseous abnormalities. Mild (approximately 5%) compression deformity of the T11 vertebral body, similar to  abdominal CT performed in 2018. Mild-to-moderate multilevel lumbar spine DDD,  worse at L5-S1 with disc space height loss, endplate irregularity and sclerosis. Post left total hip replacement. Avascular necrosis of the right femoral head with associated mild secondary degenerative change but without articular surface collapse. Review of the MIP images confirms the above findings. IMPRESSION: Chest CTA impression: 1. No acute cardiopulmonary disease. Specifically, no evidence of pulmonary embolism. 2. Cardiomegaly. 3. Coronary artery calcifications. Aortic Atherosclerosis (ICD10-I70.0). Abdomen and pelvic CT impression: 1. No acute findings within the abdomen or pelvis. 2. Redemonstrated duodenal and jejunal diverticuli without associated wall thickening or mesenteric stranding, both of which are of doubtful clinical concern. 3. Post appendectomy and cholecystectomy. 4. Avascular necrosis of the right femoral head with associated mild degenerative change but without articular surface collapse. 5.  Aortic Atherosclerosis (ICD10-I70.0). Electronically Signed   By: Simonne ComeJohn  Watts M.D.   On: 05/22/2019 07:38   Dg Chest Port 1 View  Result Date: 05/22/2019 CLINICAL DATA:  Chest pain. EXAM: PORTABLE CHEST 1 VIEW COMPARISON:  05/07/2016; chest CT-03/20/2012 FINDINGS: Grossly unchanged cardiac silhouette and mediastinal contours given reduced lung volumes and patient positioning. There is persistent thickening of the right paratracheal stripe presumably secondary to prominent vasculature. Mild pulmonary venous congestion without frank evidence of edema. No discrete focal airspace opacities. No pleural effusion or pneumothorax. No acute osseous abnormalities. IMPRESSION: Suspected pulmonary venous congestion without frank evidence of edema on this AP portable examination. Electronically Signed   By: Simonne ComeJohn  Watts M.D.   On: 05/22/2019 06:17    Wt Readings from Last 3 Encounters:  05/22/19 77.1 kg  05/05/19 77.3 kg   03/16/19 79.4 kg    EKG: Sinus rhythm with no ischemia  Physical Exam: Elderly Caucasian male complaining of chest pain with palpation Blood pressure (!) 103/59, pulse 72, temperature 99 F (37.2 C), temperature source Oral, resp. rate 19, height 5\' 7"  (1.702 m), weight 77.1 kg, SpO2 99 %. Body mass index is 26.63 kg/m. General: Well developed, well nourished, in no acute distress. Head: Normocephalic, atraumatic, sclera non-icteric, no xanthomas, nares are without discharge.  Neck: Negative for carotid bruits. JVD not elevated. Lungs: Clear bilaterally to auscultation without wheezes, rales, or rhonchi. Breathing is unlabored. Heart: RRR with S1 S2. No murmurs, rubs, or gallops appreciated. Abdomen: Soft, non-tender, non-distended with normoactive bowel sounds. No hepatomegaly. No rebound/guarding. No obvious abdominal masses. Msk:  Strength and tone appear normal for age. Extremities: No clubbing or cyanosis. No edema.  Distal pedal pulses are 2+ and equal bilaterally. Neuro: Alert and oriented X 3. No facial asymmetry. No focal deficit. Moves all extremities spontaneously. Psych:  Responds to questions appropriately with a normal affect.     Assessment and Plan  85 year old male with history of atypical chest pain with normal stress echo in 2018 admitted with atypical chest pain worse with deep breathing and deep palpation.  Has ruled out for myocardial infarction with high-sensitivity troponin x2 normal.  Had an apparent syncopal episode which had some vasovagal features given he was hot and nauseated during the event.  Does not appear that he was on telemetry at the time.  Currently has sinus rhythm.  Etiology of the chest pain is unclear.  Has ruled out for myocardial infarction based on cardiac high-sensitivity troponin.  EKG shows no acute ischemia.  Chest CT reveals no pulmonary embolus or acute process.  We will continue to follow.  Is not a candidate for heparin or invasive  cardiac evaluation.  Will follow on telemetry.  Anti-inflammatories for the chest pain may be appropriate.  Ivin Booty MD 05/22/2019, 11:24 AM Pager: [phone]

## 2019-05-22 NOTE — ED Provider Notes (Signed)
[facility] Emergency Department Provider Note   First MD Initiated Contact with Patient 05/22/19 [phone]     (approximate)  I have reviewed the triage vital signs and the nursing notes.   HISTORY  Chief Complaint Chest Pain and Loss of Consciousness    HPI Bill Cooper is a 85 y.o. male with below list of previous medical conditions including DVT presents to the emergency department via private vehicle secondary to central chest pain with radiation to the left shoulder which patient states began this morning at 2 AM.  On arrival patient had a syncopal episode in triage while EKG was being performed and as such was brought immediately to the room.  Patient did lose bladder control during syncopal episode.  On my arrival to the room patient admits to chest pain with radiation to the left shoulder.  Patient also admits to abdominal discomfort.  Patient admits to nausea.  Patient's current pain score 10 out of 10.       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   Sepsis (HCC) 05/06/2016   History of DVT (deep vein thrombosis) 10/11/2015   Peripheral tear of medial meniscus as current injury 03/15/2014   Benign prostatic hyperplasia with lower urinary tract symptoms 01/25/2013    Past Surgical History:  Procedure Laterality Date   APPENDECTOMY     CATARACT EXTRACTION W/PHACO Right 12/28/2014   Procedure: CATARACT EXTRACTION PHACO AND INTRAOCULAR LENS PLACEMENT (IOC);   Surgeon: Lockie Mola, MD;  Location: [facility];  Service: Ophthalmology;  Laterality: Right;   CHOLECYSTECTOMY     COLONOSCOPY  2006   KNEE ARTHROSCOPY     TOTAL HIP ARTHROPLASTY Bilateral    UPPER GI ENDOSCOPY  2006    Prior to Admission medications   Medication Sig Start Date End Date Taking? Authorizing Provider  amoxicillin (AMOXIL) 500 MG tablet TK 4 TS PO 1 HOUR PRIOR TO DENTAL PROCEDURE 08/27/18   [provider]  aspirin EC 81 MG tablet Take by mouth.    [provider]  famotidine (PEPCID) 20 MG tablet Take by mouth.    [provider]  latanoprost (XALATAN) 0.005 % ophthalmic solution INT 1 GTT IN OU QHS 02/19/18   [provider]  Multiple Vitamin (MULTIVITAMIN) capsule Take 1 capsule by mouth daily.    [provider]  omeprazole (PRILOSEC) 20 MG capsule TAKE 1 CAPSULE(20 MG) BY MOUTH EVERY DAY AS NEEDED 11/27/16   [provider]  timolol (TIMOPTIC) 0.5 % ophthalmic solution INT 1 GTT IN OU QD 05/11/18   [provider]  triamcinolone cream (KENALOG) 0.1 % APP EXT AA BID 01/08/18   [provider]  trospium (SANCTURA) 20 MG tablet 1 tablet 1 hour prior to bedtime 03/16/19   Stoioff, Verna Czech, MD    Allergies Patient has no known allergies.  Family History  Problem Relation Age of Onset   CAD Father  Colon cancer Brother    Liver cancer Brother    Lung cancer Brother        and colon   Prostate cancer Brother     Social History Social History   Tobacco Use   Smoking status: Never Smoker   Smokeless tobacco: Never Used  Substance Use Topics   Alcohol use: No    Alcohol/week: 0.0 standard drinks   Drug use: No    Review of Systems Constitutional: No fever/chills Eyes: No visual changes. ENT: No sore throat. Cardiovascular: Positive for chest pain. Respiratory: Denies shortness of breath. Gastrointestinal: No abdominal pain.  No nausea, no vomiting.  No  diarrhea.  No constipation. Genitourinary: Negative for dysuria. Musculoskeletal: Negative for neck pain.  Negative for back pain. Integumentary: Negative for rash. Neurological: Negative for headaches, focal weakness or numbness.  Positive for syncopal episode.  ____________________________________________   PHYSICAL EXAM:  VITAL SIGNS: ED Triage Vitals  Enc Vitals Group     BP 05/22/19 0555 (!) 94/52     Pulse Rate 05/22/19 0555 64     Resp 05/22/19 0555 19     Temp 05/22/19 0555 99 F (37.2 C)     Temp Source 05/22/19 0555 Oral     SpO2 05/22/19 0555 96 %     Weight 05/22/19 0601 77.1 kg (170 lb)     Height 05/22/19 0601 1.702 m (5\' 7" )     Head Circumference --      Peak Flow --      Pain Score 05/22/19 0600 10     Pain Loc --      Pain Edu? --      Excl. in GC? --     Constitutional: Alert and oriented.  Ill-appearing Eyes: Conjunctivae are normal.  Mouth/Throat: Mucous membranes are moist. Neck: No stridor.  No meningeal signs.   Cardiovascular: Normal rate, regular rhythm. Good peripheral circulation. Grossly normal heart sounds. Respiratory: Normal respiratory effort.  No retractions. Gastrointestinal: Epigastric/left lower quadrant tenderness to palpation.. No distention.  Musculoskeletal: No lower extremity tenderness nor edema. No gross deformities of extremities. Neurologic:  Normal speech and language. No gross focal neurologic deficits are appreciated.  Skin:  Skin is warm, dry and intact.  Pale Psychiatric: Mood and affect are normal. Speech and behavior are normal.  ____________________________________________   LABS (all labs ordered are listed, but only abnormal results are displayed)  Labs Reviewed  GLUCOSE, CAPILLARY - Abnormal; Notable for the following components:      Result Value   Glucose-Capillary 146 (*)    All other components within normal limits  CBC - Abnormal; Notable for the following components:   WBC 10.7 (*)    All other  components within normal limits  BASIC METABOLIC PANEL  TROPONIN I (HIGH SENSITIVITY)   ____________________________________________  EKG  ED ECG REPORT I, [place] N Berenize Gatlin, the attending physician, personally viewed and interpreted this ECG.   Date: 05/22/2019  EKG Time: 5:46 AM  Rate: 60  Rhythm: Normal sinus rhythm  Axis: Normal  Intervals: Normal  ST&T Change: None  ED ECG REPORT I, [place] N Simisola Sandles, the attending physician, personally viewed and interpreted this ECG.   Date: 05/22/2019  EKG Time: 5:52 AM  Rate: 61  Rhythm: Normal sinus rhythm  Axis: Normal  Intervals: Normal  ST&T Change: None   ____________________________________________  RADIOLOGY I, [place] N Halima Fogal, personally viewed and evaluated these images (plain radiographs) as part of my medical decision making, as well as reviewing the  written report by the radiologist.  ED MD interpretation: Suspected pulmonary venous congestion without frank pulmonary edema per radiologist on chest x-ray interpretation.  Official radiology report(s): Dg Chest Port 1 View  Result Date: 05/22/2019 CLINICAL DATA:  Chest pain. EXAM: PORTABLE CHEST 1 VIEW COMPARISON:  05/07/2016; chest CT-03/20/2012 FINDINGS: Grossly unchanged cardiac silhouette and mediastinal contours given reduced lung volumes and patient positioning. There is persistent thickening of the right paratracheal stripe presumably secondary to prominent vasculature. Mild pulmonary venous congestion without frank evidence of edema. No discrete focal airspace opacities. No pleural effusion or pneumothorax. No acute osseous abnormalities. IMPRESSION: Suspected pulmonary venous congestion without frank evidence of edema on this AP portable examination. Electronically Signed   By: Sandi Mariscal M.D.   On: 05/22/2019 06:17     Procedures   ____________________________________________   INITIAL IMPRESSION / MDM / [place] / ED COURSE  As part of my  medical decision making, I reviewed the following data within the [place] NUMBER  85 year old male presenting with above-stated history and physical exam secondary to chest pain and witnessed syncopal episode here in the emergency department.  Patient given IV morphine 2 mg and Zofran 4 mg with minimal improvement in the patient's pain.  Patient subsequently given IV morphine 4 mg.  Considering the possibility of aortic dissection, MI, pulmonary emboli,.  CT scan of the chest and abdomen pending at this time.  Patient's care transferred to Dr. Joan Mayans ____________________________________________  FINAL CLINICAL IMPRESSION(S) / ED DIAGNOSES  Final diagnoses:  Chest pain, unspecified type  Syncope, unspecified syncope type     MEDICATIONS GIVEN DURING THIS VISIT:  Medications  ondansetron (ZOFRAN) 4 MG/2ML injection (has no administration in time range)  morphine 2 MG/ML injection 2 mg (has no administration in time range)     ED Discharge Orders    None      *Please note:  Bill Cooper was evaluated in Emergency Department on 05/22/2019 for the symptoms described in the history of present illness. He was evaluated in the context of the global COVID-19 pandemic, which necessitated consideration that the patient might be at risk for infection with the SARS-CoV-2 virus that causes COVID-19. Institutional protocols and algorithms that pertain to the evaluation of patients at risk for COVID-19 are in a state of rapid change based on information released by regulatory bodies including the CDC and federal and state organizations. These policies and algorithms were followed during the patient's care in the ED.  Some ED evaluations and interventions may be delayed as a result of limited staffing during the pandemic.*  Note:  This document was prepared using Dragon voice recognition software and may include unintentional dictation errors.   Gregor Hams, MD 05/22/19 [phone]

## 2019-05-22 NOTE — ED Notes (Signed)
Immediately after obtaining EKG, pt states "I don't feel good, i'm really hot, i'm nauseated" and immediately lost consciousness. Pt with 1+ radial pulse felt, skin pale and diaphoretic. Pt roomed to #6. Dr. Owens Shark at bedside. Pt lost control of bladder in process.

## 2019-05-22 NOTE — ED Notes (Signed)
Patient fainted in lobby, brought back emergently

## 2019-05-22 NOTE — Plan of Care (Signed)
  Problem: Education: Goal: Knowledge of General Education information will improve Description Including pain rating scale, medication(s)/side effects and non-pharmacologic comfort measures Outcome: Progressing   Problem: Nutrition: Goal: Adequate nutrition will be maintained Outcome: Progressing   Problem: Pain Managment: Goal: General experience of comfort will improve Outcome: Progressing   

## 2019-05-22 NOTE — ED Notes (Signed)
Wife says husband has chest pain, started at 2AM; does not complain of SOB  Felt hot, stated "he didn't feel good", passed out in wheelchair

## 2019-05-22 NOTE — Progress Notes (Addendum)
Pt complaining of chest pain 7 out 10 pain of the center chest. Pt describe it 2 elepphant sitting on chest. Morphine 2 mg to pt and place on 2 liter oxygen. Pt states chest pain worst on deep inspiration. Notify prime. Will continue to monitor.  Update 2235: pt states pain is at 6 now  Update 2239: Talked to Dr. Jannifer Franklin and states will place order. Will continue to monitor.

## 2019-05-22 NOTE — ED Notes (Signed)
Pt and pt's wife given graham crackers, peanut butter, and ginger ale as requested.

## 2019-05-22 NOTE — ED Provider Notes (Signed)
Received patient in signout.  See H&P for full details.  In short, patient is 85 years old with a history of DVT who presents to the emergency department for chest pain, on arrival to the ED he had a witnessed syncopal episode with initial blood pressure 90s/50s.   EKG was without acute ischemic changes.  Initial high-sensitivity troponin negative.  CT scan of the chest and abdomen negative for dissection or PE.  Blood pressure is improved.  Given morphine, Zofran, aspirin.  Will admit for further cardiac work-up.  Discussed with hospitalist for admission.   Lilia Pro., MD 05/22/19 0800

## 2019-05-22 NOTE — Progress Notes (Signed)
Patient complained of pain in lower abdomen. Bladder Scan revealed 735mL. Output of 639mL via in and out cath. Notified MD. Put in order to bladder scan every 8 hrs.

## 2019-05-22 NOTE — Progress Notes (Signed)
Advanced care plan.  Purpose of the Encounter: CODE STATUS  Parties in Attendance: Patient and his wife  Patient's Decision Capacity: Not intact  Subjective/Patient's story: Bill Cooper  is a 85 y.o. male with a known history of previous DVT not on any anticoagulation currently, GERD who presents with chest pain.  In the center of the chest.  Sharp chest pain in the center of his chest.  Patient was brought to the emergency room in the emergency room he had episode of syncope.    Objective/Medical story I discussed with the wife regarding his desires for cardiac and pulmonary resuscitation.    Goals of care determination:  Pt wife states he does not want cardiac or pulmonary resuscitation per his wishes.   CODE STATUS:  DNR  Time spent discussing advanced care planning: 16 minutes

## 2019-05-22 NOTE — Plan of Care (Signed)
  Problem: Education: Goal: Knowledge of General Education information will improve Description Including pain rating scale, medication(s)/side effects and non-pharmacologic comfort measures Outcome: Progressing   

## 2019-05-22 NOTE — H&P (Signed)
[facility] NAME: Bill Cooper    MR#:  [id number]  DATE OF BIRTH:  06/13/1930  DATE OF ADMISSION:  05/22/2019  PRIMARY CARE PHYSICIAN: Adin Hector, MD   REQUESTING/REFERRING PHYSICIAN: Lilia Pro., MD  CHIEF COMPLAINT:   Chief Complaint  Patient presents with  . Chest Pain  . Loss of Consciousness    HISTORY OF PRESENT ILLNESS: Bill Cooper  is a 85 y.o. male with a known history of previous DVT not on any anticoagulation currently, GERD who presents with chest pain.  In the center of the chest.  Sharp chest pain in the center of his chest.  Patient was brought to the emergency room in the emergency room he had episode of syncope.  Patient has received morphine and currently sleeping.  Wife at bedside.  She reports that he was doing okay prior to this episode.    PAST MEDICAL HISTORY:   Past Medical History:  Diagnosis Date  . DVT (deep venous thrombosis) ([place]) 2012   after hip replacement  . GERD (gastroesophageal reflux disease)    rare  . Wears dentures    full upper, partial lower  . Wears hearing aid    bilateral    PAST SURGICAL HISTORY:  Past Surgical History:  Procedure Laterality Date  . APPENDECTOMY    . CATARACT EXTRACTION W/PHACO Right 12/28/2014   Procedure: CATARACT EXTRACTION PHACO AND INTRAOCULAR LENS PLACEMENT (IOC);  Surgeon: Leandrew Koyanagi, MD;  Location: [facility];  Service: Ophthalmology;  Laterality: Right;  . CHOLECYSTECTOMY    . COLONOSCOPY  2006  . KNEE ARTHROSCOPY    . TOTAL HIP ARTHROPLASTY Bilateral   . UPPER GI ENDOSCOPY  2006    SOCIAL HISTORY:  Social History   Tobacco Use  . Smoking status: Never Smoker  . Smokeless tobacco: Never Used  Substance Use Topics  . Alcohol use: No    Alcohol/week: 0.0 standard drinks    FAMILY HISTORY:  Family History  Problem Relation Age of Onset  . CAD Father   . Colon cancer Brother   . Liver cancer Brother   .  Lung cancer Brother        and colon  . Prostate cancer Brother     DRUG ALLERGIES: No Known Allergies  REVIEW OF SYSTEMS: Patient currently drowsy  CONSTITUTIONAL: Patient currently sedated due to morphine.   MEDICATIONS AT HOME:  Prior to Admission medications   Medication Sig Start Date End Date Taking? Authorizing Provider  amoxicillin (AMOXIL) 500 MG tablet TK 4 TS PO 1 HOUR PRIOR TO DENTAL PROCEDURE 08/27/18   [provider]  aspirin EC 81 MG tablet Take by mouth.    [provider]  famotidine (PEPCID) 20 MG tablet Take by mouth.    [provider]  latanoprost (XALATAN) 0.005 % ophthalmic solution INT 1 GTT IN OU QHS 02/19/18   [provider]  Multiple Vitamin (MULTIVITAMIN) capsule Take 1 capsule by mouth daily.    [provider]  omeprazole (PRILOSEC) 20 MG capsule TAKE 1 CAPSULE(20 MG) BY MOUTH EVERY DAY AS NEEDED 11/27/16   [provider]  timolol (TIMOPTIC) 0.5 % ophthalmic solution INT 1 GTT IN OU QD 05/11/18   [provider]  triamcinolone cream (KENALOG) 0.1 % APP EXT AA BID 01/08/18   [provider]  trospium (SANCTURA) 20 MG tablet 1 tablet 1 hour prior to bedtime 03/16/19   Stoioff, Scott C,  MD      PHYSICAL EXAMINATION:   VITAL SIGNS: Blood pressure 131/65, pulse 70, temperature 99 F (37.2 C), temperature source Oral, resp. rate 16, height 5\' 7"  (1.702 m), weight 77.1 kg, SpO2 100 %.  GENERAL:  85 y.o.-year-old patient lying in the bed with no acute distress.  EYES: Pupils equal, round, reactive to light and accommodation. No scleral icterus. Extraocular muscles intact.  HEENT: Head atraumatic, normocephalic. Oropharynx and nasopharynx clear.  NECK:  Supple, no jugular venous distention. No thyroid enlargement, no tenderness.  LUNGS: Normal breath sounds bilaterally, no wheezing, rales,rhonchi or crepitation. No use of accessory muscles of respiration.  CARDIOVASCULAR: S1, S2 normal. No  murmurs, rubs, or gallops.  ABDOMEN: Soft, nontender, nondistended. Bowel sounds present. No organomegaly or mass.  EXTREMITIES: No pedal edema, cyanosis, or clubbing.  NEUROLOGIC: Currently drowsy.  PSYCHIATRIC: The patient is alert and oriented x 3.  SKIN: No obvious rash, lesion, or ulcer.   LABORATORY PANEL:   CBC Recent Labs  Lab 05/22/19 0554  WBC 10.7*  HGB 13.8  HCT 41.3  PLT 186  MCV 92.8  MCH 31.0  MCHC 33.4  RDW 12.3   ------------------------------------------------------------------------------------------------------------------  Chemistries  Recent Labs  Lab 05/22/19 0554  NA 139  K 4.1  CL 104  CO2 24  GLUCOSE 158*  BUN 13  CREATININE 0.74  CALCIUM 8.7*   ------------------------------------------------------------------------------------------------------------------ estimated creatinine clearance is 62 mL/min (by C-G formula based on SCr of 0.74 mg/dL). ------------------------------------------------------------------------------------------------------------------ No results for input(s): TSH, T4TOTAL, T3FREE, THYROIDAB in the last 72 hours.  Invalid input(s): FREET3   Coagulation profile No results for input(s): INR, PROTIME in the last 168 hours. ------------------------------------------------------------------------------------------------------------------- No results for input(s): DDIMER in the last 72 hours. -------------------------------------------------------------------------------------------------------------------  Cardiac Enzymes No results for input(s): CKMB, TROPONINI, MYOGLOBIN in the last 168 hours.  Invalid input(s): CK ------------------------------------------------------------------------------------------------------------------ Invalid input(s): POCBNP  ---------------------------------------------------------------------------------------------------------------  Urinalysis    Component Value Date/Time    COLORURINE YELLOW (A) 05/06/2016 1303   APPEARANCEUR CLEAR (A) 05/06/2016 1303   LABSPEC 1.014 05/06/2016 1303   PHURINE 5.0 05/06/2016 1303   GLUCOSEU NEGATIVE 05/06/2016 1303   HGBUR NEGATIVE 05/06/2016 1303   BILIRUBINUR NEGATIVE 05/06/2016 1303   KETONESUR TRACE (A) 05/06/2016 1303   PROTEINUR NEGATIVE 05/06/2016 1303   NITRITE NEGATIVE 05/06/2016 1303   LEUKOCYTESUR TRACE (A) 05/06/2016 1303     RADIOLOGY: Ct Angio Chest Pe W And/or Wo Contrast  Result Date: 05/22/2019 CLINICAL DATA:  Shortness of breath. Syncopal episode. Acute generalized abdominal pain. Evaluate for pulmonary embolism. EXAM: CT ANGIOGRAPHY CHEST CT ABDOMEN AND PELVIS WITH CONTRAST TECHNIQUE: Multidetector CT imaging of the chest was performed using the standard protocol during bolus administration of intravenous contrast. Multiplanar CT image reconstructions and MIPs were obtained to evaluate the vascular anatomy. Multidetector CT imaging of the abdomen and pelvis was performed using the standard protocol during bolus administration of intravenous contrast. CONTRAST:  05/24/2019 OMNIPAQUE IOHEXOL 350 MG/ML SOLN COMPARISON:  Chest CT-03/20/2012; CT abdomen pelvis-09/18/2016 FINDINGS: CTA CHEST FINDINGS Vascular Findings: There is adequate opacification of the pulmonary arterial system with the main pulmonary artery measuring 337 Hounsfield units. There are no discrete filling defects within the pulmonary arterial tree to suggest pulmonary embolism. Normal caliber the main pulmonary artery. Cardiomegaly. No pericardial effusion. Coronary artery calcifications. No evidence of thoracic aortic aneurysm or dissection. Conventional configuration of the aortic arch. The branch vessels of the aortic arch appear patent throughout their imaged courses. There is a minimal amount of atherosclerotic plaque within  the descending thoracic aorta, not resulting in a hemodynamically significant stenosis. Review of the MIP images confirms the above  findings. ---------------------------------------------------------------------------------- Nonvascular Findings: Mediastinum/Lymph Nodes: No bulky mediastinal, hilar or axillary lymphadenopathy. Lungs/Pleura: Minimal dependent subpleural ground-glass atelectasis. Ill-defined ground-glass is also seen within the nondependent portion of the right upper lobe (image 29, series 7). No discrete focal airspace opacities. No air bronchograms. No pleural effusion or pneumothorax. The central pulmonary airways appear widely patent. No discrete pulmonary nodules given limitation of the examination. Musculoskeletal: No acute or aggressive osseous abnormalities. Mild (approximately 25%) compression deformity involving the superior endplate of the T11 vertebral body, similar to abdominal CT performed in 2018. Regional soft tissues appear normal. The thyroid gland appears atrophic but without discrete nodule. _________________________________________________________ CT ABDOMEN and PELVIS FINDINGS Hepatobiliary: Normal hepatic contour. No discrete hepatic lesions. Post cholecystectomy. Similar appearing pneumobilia compatible with prior biliary sphincterotomy. No ascites. Pancreas: Normal appearance of the pancreas. Spleen: Normal appearance of the spleen. Adrenals/Urinary Tract: There is symmetric enhancement and excretion of the bilateral kidneys. There is an approximately 1.3 cm hypoattenuating cyst arising from the anterior inferior aspect of the right kidney (image 46, series 3). Note is made of several left-sided parapelvic cysts. No definite renal stones this postcontrast examination. No evidence of urinary obstruction. Normal appearance of the bilateral adrenal glands. Normal appearance of the urinary bladder given degree distention however evaluation is degraded secondary streak artifact from the patient's left total hip prosthesis. Stomach/Bowel: Moderate colonic stool burden without evidence of enteric obstruction.  Ingested enteric contrast extends to the level of the cecum and ascending colon. The appendix is not visualized compatible provided operative history. Normal appearance of the terminal ileum. Note is made of a prominent jejunal diverticuli within the left mid hemiabdomen which measures approximately 5.3 x 4.4 cm but is without associated wall thickening or adjacent mesenteric stranding, similar to the 09/2016 examination and currently of doubtful clinical concern. Note is also made of a duodenal diverticulum, also without wall thickening or mesenteric stranding (coronal image 49, series 7). No discrete areas of bowel wall thickening. No pneumoperitoneum, pneumatosis or portal venous gas. Vascular/Lymphatic: Atherosclerotic plaque within a normal caliber abdominal aorta. The major branch vessels of the abdominal aorta appear patent on this non CTA examination. No bulky retroperitoneal, mesenteric, pelvic or inguinal lymph adenopathy. Reproductive: Dystrophic calcifications are seen with the prostate gland. No free fluid the pelvic cul-de-sac. Other: Tiny mesenteric fat containing periumbilical hernia. Musculoskeletal: No acute or aggressive osseous abnormalities. Mild (approximately 5%) compression deformity of the T11 vertebral body, similar to abdominal CT performed in 2018. Mild-to-moderate multilevel lumbar spine DDD, worse at L5-S1 with disc space height loss, endplate irregularity and sclerosis. Post left total hip replacement. Avascular necrosis of the right femoral head with associated mild secondary degenerative change but without articular surface collapse. Review of the MIP images confirms the above findings. IMPRESSION: Chest CTA impression: 1. No acute cardiopulmonary disease. Specifically, no evidence of pulmonary embolism. 2. Cardiomegaly. 3. Coronary artery calcifications. Aortic Atherosclerosis (ICD10-I70.0). Abdomen and pelvic CT impression: 1. No acute findings within the abdomen or pelvis. 2.  Redemonstrated duodenal and jejunal diverticuli without associated wall thickening or mesenteric stranding, both of which are of doubtful clinical concern. 3. Post appendectomy and cholecystectomy. 4. Avascular necrosis of the right femoral head with associated mild degenerative change but without articular surface collapse. 5.  Aortic Atherosclerosis (ICD10-I70.0). Electronically Signed   By: Simonne Come M.D.   On: 05/22/2019 07:38   Ct Abdomen Pelvis W  Contrast  Result Date: 05/22/2019 CLINICAL DATA:  Shortness of breath. Syncopal episode. Acute generalized abdominal pain. Evaluate for pulmonary embolism. EXAM: CT ANGIOGRAPHY CHEST CT ABDOMEN AND PELVIS WITH CONTRAST TECHNIQUE: Multidetector CT imaging of the chest was performed using the standard protocol during bolus administration of intravenous contrast. Multiplanar CT image reconstructions and MIPs were obtained to evaluate the vascular anatomy. Multidetector CT imaging of the abdomen and pelvis was performed using the standard protocol during bolus administration of intravenous contrast. CONTRAST:  OMNIPAQUE IOHEXOL 350 MG/ML SOLN COMPARISON:  Chest CT-03/20/2012; CT abdomen pelvis-09/18/2016 FINDINGS: CTA CHEST FINDINGS Vascular Findings: There is adequate opacification of the pulmonary arterial system with the main pulmonary artery measuring 337 Hounsfield units. There are no discrete filling defects within the pulmonary arterial tree to suggest pulmonary embolism. Normal caliber the main pulmonary artery. Cardiomegaly. No pericardial effusion. Coronary artery calcifications. No evidence of thoracic aortic aneurysm or dissection. Conventional configuration of the aortic arch. The branch vessels of the aortic arch appear patent throughout their imaged courses. There is a minimal amount of atherosclerotic plaque within the descending thoracic aorta, not resulting in a hemodynamically significant stenosis. Review of the MIP images confirms the  above findings. ---------------------------------------------------------------------------------- Nonvascular Findings: Mediastinum/Lymph Nodes: No bulky mediastinal, hilar or axillary lymphadenopathy. Lungs/Pleura: Minimal dependent subpleural ground-glass atelectasis. Ill-defined ground-glass is also seen within the nondependent portion of the right upper lobe (image 29, series 7). No discrete focal airspace opacities. No air bronchograms. No pleural effusion or pneumothorax. The central pulmonary airways appear widely patent. No discrete pulmonary nodules given limitation of the examination. Musculoskeletal: No acute or aggressive osseous abnormalities. Mild (approximately 25%) compression deformity involving the superior endplate of the T11 vertebral body, similar to abdominal CT performed in 2018. Regional soft tissues appear normal. The thyroid gland appears atrophic but without discrete nodule. _________________________________________________________ CT ABDOMEN and PELVIS FINDINGS Hepatobiliary: Normal hepatic contour. No discrete hepatic lesions. Post cholecystectomy. Similar appearing pneumobilia compatible with prior biliary sphincterotomy. No ascites. Pancreas: Normal appearance of the pancreas. Spleen: Normal appearance of the spleen. Adrenals/Urinary Tract: There is symmetric enhancement and excretion of the bilateral kidneys. There is an approximately 1.3 cm hypoattenuating cyst arising from the anterior inferior aspect of the right kidney (image 46, series 3). Note is made of several left-sided parapelvic cysts. No definite renal stones this postcontrast examination. No evidence of urinary obstruction. Normal appearance of the bilateral adrenal glands. Normal appearance of the urinary bladder given degree distention however evaluation is degraded secondary streak artifact from the patient's left total hip prosthesis. Stomach/Bowel: Moderate colonic stool burden without evidence of enteric  obstruction. Ingested enteric contrast extends to the level of the cecum and ascending colon. The appendix is not visualized compatible provided operative history. Normal appearance of the terminal ileum. Note is made of a prominent jejunal diverticuli within the left mid hemiabdomen which measures approximately 5.3 x 4.4 cm but is without associated wall thickening or adjacent mesenteric stranding, similar to the 09/2016 examination and currently of doubtful clinical concern. Note is also made of a duodenal diverticulum, also without wall thickening or mesenteric stranding (coronal image 49, series 7). No discrete areas of bowel wall thickening. No pneumoperitoneum, pneumatosis or portal venous gas. Vascular/Lymphatic: Atherosclerotic plaque within a normal caliber abdominal aorta. The major branch vessels of the abdominal aorta appear patent on this non CTA examination. No bulky retroperitoneal, mesenteric, pelvic or inguinal lymph adenopathy. Reproductive: Dystrophic calcifications are seen with the prostate gland. No free fluid the pelvic cul-de-sac. Other: Tiny  mesenteric fat containing periumbilical hernia. Musculoskeletal: No acute or aggressive osseous abnormalities. Mild (approximately 5%) compression deformity of the T11 vertebral body, similar to abdominal CT performed in 2018. Mild-to-moderate multilevel lumbar spine DDD, worse at L5-S1 with disc space height loss, endplate irregularity and sclerosis. Post left total hip replacement. Avascular necrosis of the right femoral head with associated mild secondary degenerative change but without articular surface collapse. Review of the MIP images confirms the above findings. IMPRESSION: Chest CTA impression: 1. No acute cardiopulmonary disease. Specifically, no evidence of pulmonary embolism. 2. Cardiomegaly. 3. Coronary artery calcifications. Aortic Atherosclerosis (ICD10-I70.0). Abdomen and pelvic CT impression: 1. No acute findings within the abdomen or  pelvis. 2. Redemonstrated duodenal and jejunal diverticuli without associated wall thickening or mesenteric stranding, both of which are of doubtful clinical concern. 3. Post appendectomy and cholecystectomy. 4. Avascular necrosis of the right femoral head with associated mild degenerative change but without articular surface collapse. 5.  Aortic Atherosclerosis (ICD10-I70.0). Electronically Signed   By: Simonne ComeJohn  Watts M.D.   On: 05/22/2019 07:38   Dg Chest Port 1 View  Result Date: 05/22/2019 CLINICAL DATA:  Chest pain. EXAM: PORTABLE CHEST 1 VIEW COMPARISON:  05/07/2016; chest CT-03/20/2012 FINDINGS: Grossly unchanged cardiac silhouette and mediastinal contours given reduced lung volumes and patient positioning. There is persistent thickening of the right paratracheal stripe presumably secondary to prominent vasculature. Mild pulmonary venous congestion without frank evidence of edema. No discrete focal airspace opacities. No pleural effusion or pneumothorax. No acute osseous abnormalities. IMPRESSION: Suspected pulmonary venous congestion without frank evidence of edema on this AP portable examination. Electronically Signed   By: Simonne ComeJohn  Watts M.D.   On: 05/22/2019 06:17    EKG: Orders placed or performed during the hospital encounter of 05/22/19  . EKG 12-Lead  . EKG 12-Lead  . EKG 12-Lead  . EKG 12-Lead  . ED EKG  . ED EKG    IMPRESSION AND PLAN: Patient is 85 year old presenting with chest pain and syncope  1.  Chest pain: Patient had a CT scan of the chest PE has been ruled out no other abnormalities noted on CT scan of the chest I have consulted cardiology for further recommendation  2.  Syncope monitor on telemetry obtain echocardiogram of the heart Patient was hypotensive on presentation continue IV fluid  3.  Hyperglycemia check a hemoglobin A1c  4.  Glaucoma continue eyedrops  5.  Miscellaneous Lovenox    All the records are reviewed and case discussed with ED  provider. Management plans discussed with the patient, family and they are in agreement.  CODE STATUS: Code Status History    Date Active Date Inactive Code Status Order ID Comments User Context   05/06/2016 1525 05/08/2016 1226 Full Code [id number]  Alford HighlandWieting, Richard, MD ED   Advance Care Planning Activity       TOTAL TIME TAKING CARE OF THIS PATIENT: 55 minutes.    Auburn BilberryShreyang Jamee Pacholski M.D on 05/22/2019 at 8:16 AM  Between 7am to 6pm - Pager - [phone]  After 6pm go to [email or web address] - password [email or web address]  [employer] Office  [phone]  CC: Primary care physician; Lynnea FerrierKlein, Bert J III, MD

## 2019-05-23 ENCOUNTER — Inpatient Hospital Stay
Admit: 2019-05-23 | Discharge: 2019-05-23 | Disposition: A | Discharge status: Home / Self Care | Payer: Medicare Other | Attending: Internal Medicine | Admitting: Internal Medicine

## 2019-05-23 DIAGNOSIS — R0789 Other chest pain: Secondary | ICD-10-CM | POA: Diagnosis not present

## 2019-05-23 LAB — CBC
HCT: 38.2 % — ABNORMAL LOW (ref 39.0–52.0)
Hemoglobin: 12.6 g/dL — ABNORMAL LOW (ref 13.0–17.0)
MCH: 31 pg (ref 26.0–34.0)
MCHC: 33 g/dL (ref 30.0–36.0)
MCV: 93.9 fL (ref 80.0–100.0)
Platelets: 141 10*3/uL — ABNORMAL LOW (ref 150–400)
RBC: 4.07 MIL/uL — ABNORMAL LOW (ref 4.22–5.81)
RDW: 12.6 % (ref 11.5–15.5)
WBC: 10.6 10*3/uL — ABNORMAL HIGH (ref 4.0–10.5)
nRBC: 0 % (ref 0.0–0.2)

## 2019-05-23 LAB — BASIC METABOLIC PANEL
Anion gap: 7 (ref 5–15)
BUN: 12 mg/dL (ref 8–23)
CO2: 27 mmol/L (ref 22–32)
Calcium: 8.2 mg/dL — ABNORMAL LOW (ref 8.9–10.3)
Chloride: 102 mmol/L (ref 98–111)
Creatinine, Ser: 0.73 mg/dL (ref 0.61–1.24)
GFR calc Af Amer: >60 mL/min (ref >60)
GFR calc non Af Amer: >60 mL/min (ref >60)
Glucose, Bld: 121 mg/dL — ABNORMAL HIGH (ref 70–99)
Potassium: 4 mmol/L (ref 3.5–5.1)
Sodium: 136 mmol/L (ref 135–145)

## 2019-05-23 LAB — ECHOCARDIOGRAM COMPLETE
Height: 70 in
Weight: 2712 oz

## 2019-05-23 MED ORDER — TRAZODONE HCL 50 MG PO TABS
50.0000 mg | ORAL_TABLET | Freq: Every evening | ORAL | Status: DC | PRN
  Filled 2019-05-23: qty 1

## 2019-05-23 NOTE — TOC Initial Note (Signed)
Transition of Care [facility]) - Initial/Assessment Note    Patient Details  Name: Bill Cooper MRN: [id number] Date of Birth: [birth year]  Transition of Care [facility]) CM/SW Contact:    Bill Cooper, Bill Cooper Phone Number: [phone]  05/23/2019, 9:12 AM  Clinical Narrative: Clinical Social Worker (CSW) met with patient alone at bedside to discuss D/C plan. Patient was alert and oriented X3 and was sitting up in the bed eating breakfast. CSW introduced self and explained role of CSW department. Per patient he lives in [place] with her wife Bill Cooper. Patient reported that his first wife passed away and Bill Cooper is his second wife. Patient reported that Bill Cooper is his HPOA. Patient reported that he has 3 adult children and Bill Cooper has 3 adult children from previous marriages. Patient reported that he has a walker at home. Patient reported that he does not have a bedside commode however he has a handicapped commode and shower. Patient reported that he is not on oxygen at home and walks independently without a walker at baseline. Per patient he still drives and provides transportation for his wife Bill Cooper. Patient reported that he plans to return home after hospitalization. CSW will continue to follow and assist as needed.                 Expected Discharge Plan: Home/Self Care Barriers to Discharge: Continued Medical Work up   Patient Goals and CMS Choice Patient states their goals for this hospitalization and ongoing recovery are:: To go home.      Expected Discharge Plan and Services Expected Discharge Plan: Home/Self Care In-house Referral: Clinical Social Work     Living arrangements for the past 2 months: Single Family Home                                      Prior Living Arrangements/Services Living arrangements for the past 2 months: Single Family Home Lives with:: Spouse Patient language and need for interpreter reviewed:: No Do you feel safe going back to the place where you  live?: Yes      Need for Family Participation in Patient Care: No (Comment) Care giver support system in place?: Yes (comment) Current home services: DME(Patient has a walker at home.) Criminal Activity/Legal Involvement Pertinent to Current Situation/Hospitalization: No - Comment as needed  Activities of Daily Living Home Assistive Devices/Equipment: Grab bars in shower, Hearing aid, Eyeglasses ADL Screening (condition at time of admission) Patient's cognitive ability adequate to safely complete daily activities?: Yes Is the patient deaf or have difficulty hearing?: No Does the patient have difficulty seeing, even when wearing glasses/contacts?: No Does the patient have difficulty concentrating, remembering, or making decisions?: No Patient able to express need for assistance with ADLs?: Yes Does the patient have difficulty dressing or bathing?: No Independently performs ADLs?: Yes (appropriate for developmental age) Does the patient have difficulty walking or climbing stairs?: No Weakness of Legs: None Weakness of Arms/Hands: None  Permission Sought/Granted                  Emotional Assessment Appearance:: Appears stated age Attitude/Demeanor/Rapport: Engaged Affect (typically observed): Calm, Pleasant Orientation: : Oriented to Self, Oriented to Place, Oriented to  Time, Oriented to Situation Alcohol / Substance Use: Not Applicable Psych Involvement: No (comment)  Admission diagnosis:  Syncope, unspecified syncope type [R55] Chest pain, unspecified type [R07.9] Patient Active Problem List   Diagnosis Date Noted  .  Syncope and collapse 05/22/2019  . Nocturia 03/16/2019  . Bilateral lower abdominal pain 07/16/2018  . Change in bowel function 07/16/2018  . Arthritis 11/18/2016  . Chest pain with high risk for cardiac etiology 10/18/2016  . 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 Sepsis ([place]) 05/06/2016  . History of DVT (deep vein thrombosis) 10/11/2015  . Peripheral  tear of medial meniscus as current injury 03/15/2014  . Benign prostatic hyperplasia with lower urinary tract symptoms 01/25/2013   PCP:  Adin Hector, MD Pharmacy:   [id number] [address], [place] [address] Phone: [phone] Fax: [phone]     Social Determinants of Health (SDOH) Interventions    Readmission Risk Interventions No flowsheet data found.

## 2019-05-23 NOTE — Progress Notes (Addendum)
Pt is requesting something to help with sleep. Notify Prime. Will continue to monitor.  Update 2155: Dr. Jannifer Franklin place order for  Trazodone 50 mg oral daily at bedtime as needed. Will continue to monitor.

## 2019-05-23 NOTE — Progress Notes (Addendum)
Pt Bladder scan at 0028 is at 105. Pt is voiding without needing in and out cath. Will continue to monitor.   Update 0636: Pt was complaining of pain 7/10 on the tip of his penis and around the shaft. Pt states that it hurts more when he urinate. Morphine 2 mg was given. Notify prime and talked to Dr. Marcille Blanco and states will notify incoming attending. Will notify incoming shift. Will continue to monitor.

## 2019-05-23 NOTE — Progress Notes (Signed)
Patient Name: Bill Cooper Date of Encounter: 05/23/2019  Hospital Problem List     Active Problems:   Syncope and collapse    Patient Profile     85 yo admitted with reproducible chest pain worsening with deep palpation and with deep inspiration and an episode of witnessed syncope while being wheeled into the er. Has ruled out for an mi. No further chest pian today. Echo revealed. Normal lv function with no significant valve disease. No arrhythmia noted.   Subjective   No chest pian or sob.   Inpatient Medications    . aspirin EC  81 mg Oral Daily  . enoxaparin (LOVENOX) injection  40 mg Subcutaneous Q24H  . pantoprazole (PROTONIX) IV  40 mg Intravenous Q12H    Vital Signs    Vitals:   05/22/19 2238 05/23/19 0430 05/23/19 0620 05/23/19 0729  BP:  104/62 110/75 (!) 133/51  Pulse:  71  74  Resp:  20    Temp:  98.5 F (36.9 C)  98 F (36.7 C)  TempSrc:  Oral  Oral  SpO2: 95% 97%  98%  Weight:  76.9 kg    Height:        Intake/Output Summary (Last 24 hours) at 05/23/2019 1521 Last data filed at 05/23/2019 1437 Gross per 24 hour  Intake 1320.68 ml  Output 1385 ml  Net -64.32 ml   Filed Weights   05/22/19 0601 05/22/19 1332 05/23/19 0430  Weight: 77.1 kg 75.9 kg 76.9 kg    Physical Exam    GEN: Well nourished, well developed, in no acute distress.  HEENT: normal.  Neck: Supple, no JVD, carotid bruits, or masses. Cardiac: RRR, no murmurs, rubs, or gallops. No clubbing, cyanosis, edema.  Radials/DP/PT 2+ and equal bilaterally.  Respiratory:  Respirations regular and unlabored, clear to auscultation bilaterally. GI: Soft, nontender, nondistended, BS + x 4. MS: no deformity or atrophy. Skin: warm and dry, no rash. Neuro:  Strength and sensation are intact. Psych: Normal affect.  Labs    CBC Recent Labs    05/22/19 0554 05/23/19 0511  WBC 10.7* 10.6*  HGB 13.8 12.6*  HCT 41.3 38.2*  MCV 92.8 93.9  PLT 186 141*   Basic Metabolic Panel Recent  Labs    05/22/19 0554 05/23/19 0511  NA 139 136  K 4.1 4.0  CL 104 102  CO2 24 27  GLUCOSE 158* 121*  BUN 13 12  CREATININE 0.74 0.73  CALCIUM 8.7* 8.2*   Liver Function Tests No results for input(s): AST, ALT, ALKPHOS, BILITOT, PROT, ALBUMIN in the last 72 hours. No results for input(s): LIPASE, AMYLASE in the last 72 hours. Cardiac Enzymes No results for input(s): CKTOTAL, CKMB, CKMBINDEX, TROPONINI in the last 72 hours. BNP No results for input(s): BNP in the last 72 hours. D-Dimer No results for input(s): DDIMER in the last 72 hours. Hemoglobin A1C No results for input(s): HGBA1C in the last 72 hours. Fasting Lipid Panel No results for input(s): CHOL, HDL, LDLCALC, TRIG, CHOLHDL, LDLDIRECT in the last 72 hours. Thyroid Function Tests Recent Labs    05/22/19 1355  TSH 0.812    Telemetry    nsr with pvcs  ECG    nsr with no ischemia  Radiology    Ct Angio Chest Pe W And/or Wo Contrast  Result Date: 05/22/2019 CLINICAL DATA:  Shortness of breath. Syncopal episode. Acute generalized abdominal pain. Evaluate for pulmonary embolism. EXAM: CT ANGIOGRAPHY CHEST CT ABDOMEN AND PELVIS WITH CONTRAST TECHNIQUE:  Multidetector CT imaging of the chest was performed using the standard protocol during bolus administration of intravenous contrast. Multiplanar CT image reconstructions and MIPs were obtained to evaluate the vascular anatomy. Multidetector CT imaging of the abdomen and pelvis was performed using the standard protocol during bolus administration of intravenous contrast. CONTRAST:  OMNIPAQUE IOHEXOL 350 MG/ML SOLN COMPARISON:  Chest CT-03/20/2012; CT abdomen pelvis-09/18/2016 FINDINGS: CTA CHEST FINDINGS Vascular Findings: There is adequate opacification of the pulmonary arterial system with the main pulmonary artery measuring 337 Hounsfield units. There are no discrete filling defects within the pulmonary arterial tree to suggest pulmonary embolism. Normal caliber  the main pulmonary artery. Cardiomegaly. No pericardial effusion. Coronary artery calcifications. No evidence of thoracic aortic aneurysm or dissection. Conventional configuration of the aortic arch. The branch vessels of the aortic arch appear patent throughout their imaged courses. There is a minimal amount of atherosclerotic plaque within the descending thoracic aorta, not resulting in a hemodynamically significant stenosis. Review of the MIP images confirms the above findings. ---------------------------------------------------------------------------------- Nonvascular Findings: Mediastinum/Lymph Nodes: No bulky mediastinal, hilar or axillary lymphadenopathy. Lungs/Pleura: Minimal dependent subpleural ground-glass atelectasis. Ill-defined ground-glass is also seen within the nondependent portion of the right upper lobe (image 29, series 7). No discrete focal airspace opacities. No air bronchograms. No pleural effusion or pneumothorax. The central pulmonary airways appear widely patent. No discrete pulmonary nodules given limitation of the examination. Musculoskeletal: No acute or aggressive osseous abnormalities. Mild (approximately 25%) compression deformity involving the superior endplate of the T11 vertebral body, similar to abdominal CT performed in 2018. Regional soft tissues appear normal. The thyroid gland appears atrophic but without discrete nodule. _________________________________________________________ CT ABDOMEN and PELVIS FINDINGS Hepatobiliary: Normal hepatic contour. No discrete hepatic lesions. Post cholecystectomy. Similar appearing pneumobilia compatible with prior biliary sphincterotomy. No ascites. Pancreas: Normal appearance of the pancreas. Spleen: Normal appearance of the spleen. Adrenals/Urinary Tract: There is symmetric enhancement and excretion of the bilateral kidneys. There is an approximately 1.3 cm hypoattenuating cyst arising from the anterior inferior aspect of the right kidney  (image 46, series 3). Note is made of several left-sided parapelvic cysts. No definite renal stones this postcontrast examination. No evidence of urinary obstruction. Normal appearance of the bilateral adrenal glands. Normal appearance of the urinary bladder given degree distention however evaluation is degraded secondary streak artifact from the patient's left total hip prosthesis. Stomach/Bowel: Moderate colonic stool burden without evidence of enteric obstruction. Ingested enteric contrast extends to the level of the cecum and ascending colon. The appendix is not visualized compatible provided operative history. Normal appearance of the terminal ileum. Note is made of a prominent jejunal diverticuli within the left mid hemiabdomen which measures approximately 5.3 x 4.4 cm but is without associated wall thickening or adjacent mesenteric stranding, similar to the 09/2016 examination and currently of doubtful clinical concern. Note is also made of a duodenal diverticulum, also without wall thickening or mesenteric stranding (coronal image 49, series 7). No discrete areas of bowel wall thickening. No pneumoperitoneum, pneumatosis or portal venous gas. Vascular/Lymphatic: Atherosclerotic plaque within a normal caliber abdominal aorta. The major branch vessels of the abdominal aorta appear patent on this non CTA examination. No bulky retroperitoneal, mesenteric, pelvic or inguinal lymph adenopathy. Reproductive: Dystrophic calcifications are seen with the prostate gland. No free fluid the pelvic cul-de-sac. Other: Tiny mesenteric fat containing periumbilical hernia. Musculoskeletal: No acute or aggressive osseous abnormalities. Mild (approximately 5%) compression deformity of the T11 vertebral body, similar to abdominal CT performed in 2018. Mild-to-moderate multilevel lumbar  spine DDD, worse at L5-S1 with disc space height loss, endplate irregularity and sclerosis. Post left total hip replacement. Avascular necrosis  of the right femoral head with associated mild secondary degenerative change but without articular surface collapse. Review of the MIP images confirms the above findings. IMPRESSION: Chest CTA impression: 1. No acute cardiopulmonary disease. Specifically, no evidence of pulmonary embolism. 2. Cardiomegaly. 3. Coronary artery calcifications. Aortic Atherosclerosis (ICD10-I70.0). Abdomen and pelvic CT impression: 1. No acute findings within the abdomen or pelvis. 2. Redemonstrated duodenal and jejunal diverticuli without associated wall thickening or mesenteric stranding, both of which are of doubtful clinical concern. 3. Post appendectomy and cholecystectomy. 4. Avascular necrosis of the right femoral head with associated mild degenerative change but without articular surface collapse. 5.  Aortic Atherosclerosis (ICD10-I70.0). Electronically Signed   By: Simonne Come M.D.   On: 05/22/2019 07:38   Ct Abdomen Pelvis W Contrast  Result Date: 05/22/2019 CLINICAL DATA:  Shortness of breath. Syncopal episode. Acute generalized abdominal pain. Evaluate for pulmonary embolism. EXAM: CT ANGIOGRAPHY CHEST CT ABDOMEN AND PELVIS WITH CONTRAST TECHNIQUE: Multidetector CT imaging of the chest was performed using the standard protocol during bolus administration of intravenous contrast. Multiplanar CT image reconstructions and MIPs were obtained to evaluate the vascular anatomy. Multidetector CT imaging of the abdomen and pelvis was performed using the standard protocol during bolus administration of intravenous contrast. CONTRAST:  OMNIPAQUE IOHEXOL 350 MG/ML SOLN COMPARISON:  Chest CT-03/20/2012; CT abdomen pelvis-09/18/2016 FINDINGS: CTA CHEST FINDINGS Vascular Findings: There is adequate opacification of the pulmonary arterial system with the main pulmonary artery measuring 337 Hounsfield units. There are no discrete filling defects within the pulmonary arterial tree to suggest pulmonary embolism. Normal caliber the  main pulmonary artery. Cardiomegaly. No pericardial effusion. Coronary artery calcifications. No evidence of thoracic aortic aneurysm or dissection. Conventional configuration of the aortic arch. The branch vessels of the aortic arch appear patent throughout their imaged courses. There is a minimal amount of atherosclerotic plaque within the descending thoracic aorta, not resulting in a hemodynamically significant stenosis. Review of the MIP images confirms the above findings. ---------------------------------------------------------------------------------- Nonvascular Findings: Mediastinum/Lymph Nodes: No bulky mediastinal, hilar or axillary lymphadenopathy. Lungs/Pleura: Minimal dependent subpleural ground-glass atelectasis. Ill-defined ground-glass is also seen within the nondependent portion of the right upper lobe (image 29, series 7). No discrete focal airspace opacities. No air bronchograms. No pleural effusion or pneumothorax. The central pulmonary airways appear widely patent. No discrete pulmonary nodules given limitation of the examination. Musculoskeletal: No acute or aggressive osseous abnormalities. Mild (approximately 25%) compression deformity involving the superior endplate of the T11 vertebral body, similar to abdominal CT performed in 2018. Regional soft tissues appear normal. The thyroid gland appears atrophic but without discrete nodule. _________________________________________________________ CT ABDOMEN and PELVIS FINDINGS Hepatobiliary: Normal hepatic contour. No discrete hepatic lesions. Post cholecystectomy. Similar appearing pneumobilia compatible with prior biliary sphincterotomy. No ascites. Pancreas: Normal appearance of the pancreas. Spleen: Normal appearance of the spleen. Adrenals/Urinary Tract: There is symmetric enhancement and excretion of the bilateral kidneys. There is an approximately 1.3 cm hypoattenuating cyst arising from the anterior inferior aspect of the right kidney  (image 46, series 3). Note is made of several left-sided parapelvic cysts. No definite renal stones this postcontrast examination. No evidence of urinary obstruction. Normal appearance of the bilateral adrenal glands. Normal appearance of the urinary bladder given degree distention however evaluation is degraded secondary streak artifact from the patient's left total hip prosthesis. Stomach/Bowel: Moderate colonic stool burden without evidence of enteric  obstruction. Ingested enteric contrast extends to the level of the cecum and ascending colon. The appendix is not visualized compatible provided operative history. Normal appearance of the terminal ileum. Note is made of a prominent jejunal diverticuli within the left mid hemiabdomen which measures approximately 5.3 x 4.4 cm but is without associated wall thickening or adjacent mesenteric stranding, similar to the 09/2016 examination and currently of doubtful clinical concern. Note is also made of a duodenal diverticulum, also without wall thickening or mesenteric stranding (coronal image 49, series 7). No discrete areas of bowel wall thickening. No pneumoperitoneum, pneumatosis or portal venous gas. Vascular/Lymphatic: Atherosclerotic plaque within a normal caliber abdominal aorta. The major branch vessels of the abdominal aorta appear patent on this non CTA examination. No bulky retroperitoneal, mesenteric, pelvic or inguinal lymph adenopathy. Reproductive: Dystrophic calcifications are seen with the prostate gland. No free fluid the pelvic cul-de-sac. Other: Tiny mesenteric fat containing periumbilical hernia. Musculoskeletal: No acute or aggressive osseous abnormalities. Mild (approximately 5%) compression deformity of the T11 vertebral body, similar to abdominal CT performed in 2018. Mild-to-moderate multilevel lumbar spine DDD, worse at L5-S1 with disc space height loss, endplate irregularity and sclerosis. Post left total hip replacement. Avascular necrosis  of the right femoral head with associated mild secondary degenerative change but without articular surface collapse. Review of the MIP images confirms the above findings. IMPRESSION: Chest CTA impression: 1. No acute cardiopulmonary disease. Specifically, no evidence of pulmonary embolism. 2. Cardiomegaly. 3. Coronary artery calcifications. Aortic Atherosclerosis (ICD10-I70.0). Abdomen and pelvic CT impression: 1. No acute findings within the abdomen or pelvis. 2. Redemonstrated duodenal and jejunal diverticuli without associated wall thickening or mesenteric stranding, both of which are of doubtful clinical concern. 3. Post appendectomy and cholecystectomy. 4. Avascular necrosis of the right femoral head with associated mild degenerative change but without articular surface collapse. 5.  Aortic Atherosclerosis (ICD10-I70.0). Electronically Signed   By: Sandi Mariscal M.D.   On: 05/22/2019 07:38   Dg Chest Port 1 View  Result Date: 05/22/2019 CLINICAL DATA:  Chest pain. EXAM: PORTABLE CHEST 1 VIEW COMPARISON:  05/07/2016; chest CT-03/20/2012 FINDINGS: Grossly unchanged cardiac silhouette and mediastinal contours given reduced lung volumes and patient positioning. There is persistent thickening of the right paratracheal stripe presumably secondary to prominent vasculature. Mild pulmonary venous congestion without frank evidence of edema. No discrete focal airspace opacities. No pleural effusion or pneumothorax. No acute osseous abnormalities. IMPRESSION: Suspected pulmonary venous congestion without frank evidence of edema on this AP portable examination. Electronically Signed   By: Sandi Mariscal M.D.   On: 05/22/2019 06:17    Assessment & Plan    Syncope-etiology unclear. Was snote on the monitor during the episode. Reported nausea, diapharesis and abdominal pain immediately prior to the episode. May ave been vasovagal due to the pain. Will conitnue to monitor rhythm and electrolytes.   CHest pian-has ruled  out for an mi. Was worsened by palpation and deep breathing. Did not appear to be ischemic based on ekg or troponin. Not present this am. Echo showed no regional wall motion abnormality. Electrolytes and renal function normal. Will continue to follow.   Signed, Javier Docker Kobi Mario MD 05/23/2019, 3:21 PM  Pager: [phone]

## 2019-05-23 NOTE — Progress Notes (Signed)
*  PRELIMINARY RESULTS* Echocardiogram 2D Echocardiogram has been performed.  Bill Cooper 05/23/2019, 10:05 AM

## 2019-05-23 NOTE — Progress Notes (Signed)
[facility] - [place] at [facility]                                                                                                                                                                                  Patient Demographics   Bill Cooper, is a 85 y.o. male, DOB - 1930/06/21, ZOX:[id number]  Admit date - 05/22/2019   Admitting Physician Auburn Bilberry, MD  Outpatient Primary MD for the patient is Lynnea Ferrier, MD   LOS - 1  Subjective: Patient has having issues with urinary retention. He had chest pain yesterday which was reproducible in nature.   Review of Systems:   CONSTITUTIONAL: No documented fever. No fatigue, weakness. No weight gain, no weight loss.  EYES: No blurry or double vision.  ENT: No tinnitus. No postnasal drip. No redness of the oropharynx.  RESPIRATORY: No cough, no wheeze, no hemoptysis. No dyspnea.  CARDIOVASCULAR: Positive chest pain. No orthopnea. No palpitations. No syncope.  GASTROINTESTINAL: No nausea, no vomiting or diarrhea. No abdominal pain. No melena or hematochezia.  GENITOURINARY: No dysuria or hematuria.  ENDOCRINE: No polyuria or nocturia. No heat or cold intolerance.  HEMATOLOGY: No anemia. No bruising. No bleeding.  INTEGUMENTARY: No rashes. No lesions.  MUSCULOSKELETAL: No arthritis. No swelling. No gout.  NEUROLOGIC: No numbness, tingling, or ataxia. No seizure-type activity.  PSYCHIATRIC: No anxiety. No insomnia. No ADD.    Vitals:   Vitals:   05/22/19 2238 05/23/19 0430 05/23/19 0620 05/23/19 0729  BP:  104/62 110/75 (!) 133/51  Pulse:  71  74  Resp:  20    Temp:  98.5 F (36.9 C)  98 F (36.7 C)  TempSrc:  Oral  Oral  SpO2: 95% 97%  98%  Weight:  76.9 kg    Height:        Wt Readings from Last 3 Encounters:  05/23/19 76.9 kg  05/05/19 77.3 kg  03/16/19 79.4 kg     Intake/Output Summary (Last 24 hours) at 05/23/2019 1253 Last data filed at 05/23/2019 0955 Gross per 24 hour  Intake  1320.68 ml  Output 1135 ml  Net 185.68 ml    Physical Exam:   GENERAL: Pleasant-appearing in no apparent distress.  HEAD, EYES, EARS, NOSE AND THROAT: Atraumatic, normocephalic. Extraocular muscles are intact. Pupils equal and reactive to light. Sclerae anicteric. No conjunctival injection. No oro-pharyngeal erythema.  NECK: Supple. There is no jugular venous distention. No bruits, no lymphadenopathy, no thyromegaly.  HEART: Regular rate and rhythm,. No murmurs, no rubs, no clicks.  LUNGS: Clear to auscultation bilaterally. No rales or rhonchi. No wheezes.  ABDOMEN: Soft, flat, nontender, nondistended. Has good bowel sounds. No  hepatosplenomegaly appreciated.  EXTREMITIES: No evidence of any cyanosis, clubbing, or peripheral edema.  +2 pedal and radial pulses bilaterally.  NEUROLOGIC: The patient is alert, awake, and oriented x3 with no focal motor or sensory deficits appreciated bilaterally.  SKIN: Moist and warm with no rashes appreciated.  Psych: Not anxious, depressed LN: No inguinal LN enlargement    Antibiotics   Anti-infectives (From admission, onward)   None      Medications   Scheduled Meds: . aspirin EC  81 mg Oral Daily  . enoxaparin (LOVENOX) injection  40 mg Subcutaneous Q24H  . pantoprazole (PROTONIX) IV  40 mg Intravenous Q12H   Continuous Infusions: . sodium chloride 75 mL/hr at 05/23/19 0414   PRN Meds:.acetaminophen **OR** acetaminophen, morphine injection, ondansetron **OR** ondansetron (ZOFRAN) IV   Data Review:   Micro Results Recent Results (from the past 240 hour(s))  SARS CORONAVIRUS 2 (TAT 6-24 HRS) Nasopharyngeal Nasopharyngeal Swab     Status: None   Collection Time: 05/22/19  8:54 AM   Specimen: Nasopharyngeal Swab  Result Value Ref Range Status   SARS Coronavirus 2 NEGATIVE NEGATIVE Final    Comment: (NOTE) SARS-CoV-2 target nucleic acids are NOT DETECTED. The SARS-CoV-2 RNA is generally detectable in upper and lower respiratory  specimens during the acute phase of infection. Negative results do not preclude SARS-CoV-2 infection, do not rule out co-infections with other pathogens, and should not be used as the sole basis for treatment or other patient management decisions. Negative results must be combined with clinical observations, patient history, and epidemiological information. The expected result is Negative. Fact Sheet for Patients: SugarRoll.be Fact Sheet for Healthcare Providers: https://www.woods-mathews.com/ This test is not yet approved or cleared by the [place] FDA and  has been authorized for detection and/or diagnosis of SARS-CoV-2 by FDA under an Emergency Use Authorization (EUA). This EUA will remain  in effect (meaning this test can be used) for the duration of the COVID-19 declaration under Section 56 4(b)(1) of the Act, 21 U.S.C. section 360bbb-3(b)(1), unless the authorization is terminated or revoked sooner. Performed at [facility], [place] [address], [place], [place] [address]     Radiology Reports Ct Angio Chest Pe W And/or Wo Contrast  Result Date: 05/22/2019 CLINICAL DATA:  Shortness of breath. Syncopal episode. Acute generalized abdominal pain. Evaluate for pulmonary embolism. EXAM: CT ANGIOGRAPHY CHEST CT ABDOMEN AND PELVIS WITH CONTRAST TECHNIQUE: Multidetector CT imaging of the chest was performed using the standard protocol during bolus administration of intravenous contrast. Multiplanar CT image reconstructions and MIPs were obtained to evaluate the vascular anatomy. Multidetector CT imaging of the abdomen and pelvis was performed using the standard protocol during bolus administration of intravenous contrast. CONTRAST:  158mL OMNIPAQUE IOHEXOL 350 MG/ML SOLN COMPARISON:  Chest CT-03/20/2012; CT abdomen pelvis-09/18/2016 FINDINGS: CTA CHEST FINDINGS Vascular Findings: There is adequate opacification of the pulmonary arterial system  with the main pulmonary artery measuring 337 Hounsfield units. There are no discrete filling defects within the pulmonary arterial tree to suggest pulmonary embolism. Normal caliber the main pulmonary artery. Cardiomegaly. No pericardial effusion. Coronary artery calcifications. No evidence of thoracic aortic aneurysm or dissection. Conventional configuration of the aortic arch. The branch vessels of the aortic arch appear patent throughout their imaged courses. There is a minimal amount of atherosclerotic plaque within the descending thoracic aorta, not resulting in a hemodynamically significant stenosis. Review of the MIP images confirms the above findings. ---------------------------------------------------------------------------------- Nonvascular Findings: Mediastinum/Lymph Nodes: No bulky mediastinal, hilar or axillary lymphadenopathy. Lungs/Pleura:  Minimal dependent subpleural ground-glass atelectasis. Ill-defined ground-glass is also seen within the nondependent portion of the right upper lobe (image 29, series 7). No discrete focal airspace opacities. No air bronchograms. No pleural effusion or pneumothorax. The central pulmonary airways appear widely patent. No discrete pulmonary nodules given limitation of the examination. Musculoskeletal: No acute or aggressive osseous abnormalities. Mild (approximately 25%) compression deformity involving the superior endplate of the T11 vertebral body, similar to abdominal CT performed in 2018. Regional soft tissues appear normal. The thyroid gland appears atrophic but without discrete nodule. _________________________________________________________ CT ABDOMEN and PELVIS FINDINGS Hepatobiliary: Normal hepatic contour. No discrete hepatic lesions. Post cholecystectomy. Similar appearing pneumobilia compatible with prior biliary sphincterotomy. No ascites. Pancreas: Normal appearance of the pancreas. Spleen: Normal appearance of the spleen. Adrenals/Urinary Tract:  There is symmetric enhancement and excretion of the bilateral kidneys. There is an approximately 1.3 cm hypoattenuating cyst arising from the anterior inferior aspect of the right kidney (image 46, series 3). Note is made of several left-sided parapelvic cysts. No definite renal stones this postcontrast examination. No evidence of urinary obstruction. Normal appearance of the bilateral adrenal glands. Normal appearance of the urinary bladder given degree distention however evaluation is degraded secondary streak artifact from the patient's left total hip prosthesis. Stomach/Bowel: Moderate colonic stool burden without evidence of enteric obstruction. Ingested enteric contrast extends to the level of the cecum and ascending colon. The appendix is not visualized compatible provided operative history. Normal appearance of the terminal ileum. Note is made of a prominent jejunal diverticuli within the left mid hemiabdomen which measures approximately 5.3 x 4.4 cm but is without associated wall thickening or adjacent mesenteric stranding, similar to the 09/2016 examination and currently of doubtful clinical concern. Note is also made of a duodenal diverticulum, also without wall thickening or mesenteric stranding (coronal image 49, series 7). No discrete areas of bowel wall thickening. No pneumoperitoneum, pneumatosis or portal venous gas. Vascular/Lymphatic: Atherosclerotic plaque within a normal caliber abdominal aorta. The major branch vessels of the abdominal aorta appear patent on this non CTA examination. No bulky retroperitoneal, mesenteric, pelvic or inguinal lymph adenopathy. Reproductive: Dystrophic calcifications are seen with the prostate gland. No free fluid the pelvic cul-de-sac. Other: Tiny mesenteric fat containing periumbilical hernia. Musculoskeletal: No acute or aggressive osseous abnormalities. Mild (approximately 5%) compression deformity of the T11 vertebral body, similar to abdominal CT performed in  2018. Mild-to-moderate multilevel lumbar spine DDD, worse at L5-S1 with disc space height loss, endplate irregularity and sclerosis. Post left total hip replacement. Avascular necrosis of the right femoral head with associated mild secondary degenerative change but without articular surface collapse. Review of the MIP images confirms the above findings. IMPRESSION: Chest CTA impression: 1. No acute cardiopulmonary disease. Specifically, no evidence of pulmonary embolism. 2. Cardiomegaly. 3. Coronary artery calcifications. Aortic Atherosclerosis (ICD10-I70.0). Abdomen and pelvic CT impression: 1. No acute findings within the abdomen or pelvis. 2. Redemonstrated duodenal and jejunal diverticuli without associated wall thickening or mesenteric stranding, both of which are of doubtful clinical concern. 3. Post appendectomy and cholecystectomy. 4. Avascular necrosis of the right femoral head with associated mild degenerative change but without articular surface collapse. 5.  Aortic Atherosclerosis (ICD10-I70.0). Electronically Signed   By: Simonne Come M.D.   On: 05/22/2019 07:38   Ct Abdomen Pelvis W Contrast  Result Date: 05/22/2019 CLINICAL DATA:  Shortness of breath. Syncopal episode. Acute generalized abdominal pain. Evaluate for pulmonary embolism. EXAM: CT ANGIOGRAPHY CHEST CT ABDOMEN AND PELVIS WITH CONTRAST TECHNIQUE: Multidetector CT  imaging of the chest was performed using the standard protocol during bolus administration of intravenous contrast. Multiplanar CT image reconstructions and MIPs were obtained to evaluate the vascular anatomy. Multidetector CT imaging of the abdomen and pelvis was performed using the standard protocol during bolus administration of intravenous contrast. CONTRAST:  OMNIPAQUE IOHEXOL 350 MG/ML SOLN COMPARISON:  Chest CT-03/20/2012; CT abdomen pelvis-09/18/2016 FINDINGS: CTA CHEST FINDINGS Vascular Findings: There is adequate opacification of the pulmonary arterial system  with the main pulmonary artery measuring 337 Hounsfield units. There are no discrete filling defects within the pulmonary arterial tree to suggest pulmonary embolism. Normal caliber the main pulmonary artery. Cardiomegaly. No pericardial effusion. Coronary artery calcifications. No evidence of thoracic aortic aneurysm or dissection. Conventional configuration of the aortic arch. The branch vessels of the aortic arch appear patent throughout their imaged courses. There is a minimal amount of atherosclerotic plaque within the descending thoracic aorta, not resulting in a hemodynamically significant stenosis. Review of the MIP images confirms the above findings. ---------------------------------------------------------------------------------- Nonvascular Findings: Mediastinum/Lymph Nodes: No bulky mediastinal, hilar or axillary lymphadenopathy. Lungs/Pleura: Minimal dependent subpleural ground-glass atelectasis. Ill-defined ground-glass is also seen within the nondependent portion of the right upper lobe (image 29, series 7). No discrete focal airspace opacities. No air bronchograms. No pleural effusion or pneumothorax. The central pulmonary airways appear widely patent. No discrete pulmonary nodules given limitation of the examination. Musculoskeletal: No acute or aggressive osseous abnormalities. Mild (approximately 25%) compression deformity involving the superior endplate of the T11 vertebral body, similar to abdominal CT performed in 2018. Regional soft tissues appear normal. The thyroid gland appears atrophic but without discrete nodule. _________________________________________________________ CT ABDOMEN and PELVIS FINDINGS Hepatobiliary: Normal hepatic contour. No discrete hepatic lesions. Post cholecystectomy. Similar appearing pneumobilia compatible with prior biliary sphincterotomy. No ascites. Pancreas: Normal appearance of the pancreas. Spleen: Normal appearance of the spleen. Adrenals/Urinary Tract:  There is symmetric enhancement and excretion of the bilateral kidneys. There is an approximately 1.3 cm hypoattenuating cyst arising from the anterior inferior aspect of the right kidney (image 46, series 3). Note is made of several left-sided parapelvic cysts. No definite renal stones this postcontrast examination. No evidence of urinary obstruction. Normal appearance of the bilateral adrenal glands. Normal appearance of the urinary bladder given degree distention however evaluation is degraded secondary streak artifact from the patient's left total hip prosthesis. Stomach/Bowel: Moderate colonic stool burden without evidence of enteric obstruction. Ingested enteric contrast extends to the level of the cecum and ascending colon. The appendix is not visualized compatible provided operative history. Normal appearance of the terminal ileum. Note is made of a prominent jejunal diverticuli within the left mid hemiabdomen which measures approximately 5.3 x 4.4 cm but is without associated wall thickening or adjacent mesenteric stranding, similar to the 09/2016 examination and currently of doubtful clinical concern. Note is also made of a duodenal diverticulum, also without wall thickening or mesenteric stranding (coronal image 49, series 7). No discrete areas of bowel wall thickening. No pneumoperitoneum, pneumatosis or portal venous gas. Vascular/Lymphatic: Atherosclerotic plaque within a normal caliber abdominal aorta. The major branch vessels of the abdominal aorta appear patent on this non CTA examination. No bulky retroperitoneal, mesenteric, pelvic or inguinal lymph adenopathy. Reproductive: Dystrophic calcifications are seen with the prostate gland. No free fluid the pelvic cul-de-sac. Other: Tiny mesenteric fat containing periumbilical hernia. Musculoskeletal: No acute or aggressive osseous abnormalities. Mild (approximately 5%) compression deformity of the T11 vertebral body, similar to abdominal CT performed in  2018. Mild-to-moderate multilevel lumbar spine  DDD, worse at L5-S1 with disc space height loss, endplate irregularity and sclerosis. Post left total hip replacement. Avascular necrosis of the right femoral head with associated mild secondary degenerative change but without articular surface collapse. Review of the MIP images confirms the above findings. IMPRESSION: Chest CTA impression: 1. No acute cardiopulmonary disease. Specifically, no evidence of pulmonary embolism. 2. Cardiomegaly. 3. Coronary artery calcifications. Aortic Atherosclerosis (ICD10-I70.0). Abdomen and pelvic CT impression: 1. No acute findings within the abdomen or pelvis. 2. Redemonstrated duodenal and jejunal diverticuli without associated wall thickening or mesenteric stranding, both of which are of doubtful clinical concern. 3. Post appendectomy and cholecystectomy. 4. Avascular necrosis of the right femoral head with associated mild degenerative change but without articular surface collapse. 5.  Aortic Atherosclerosis (ICD10-I70.0). Electronically Signed   By: Simonne ComeJohn  Watts M.D.   On: 05/22/2019 07:38   Dg Chest Port 1 View  Result Date: 05/22/2019 CLINICAL DATA:  Chest pain. EXAM: PORTABLE CHEST 1 VIEW COMPARISON:  05/07/2016; chest CT-03/20/2012 FINDINGS: Grossly unchanged cardiac silhouette and mediastinal contours given reduced lung volumes and patient positioning. There is persistent thickening of the right paratracheal stripe presumably secondary to prominent vasculature. Mild pulmonary venous congestion without frank evidence of edema. No discrete focal airspace opacities. No pleural effusion or pneumothorax. No acute osseous abnormalities. IMPRESSION: Suspected pulmonary venous congestion without frank evidence of edema on this AP portable examination. Electronically Signed   By: Simonne ComeJohn  Watts M.D.   On: 05/22/2019 06:17     CBC Recent Labs  Lab 05/22/19 0554 05/23/19 0511  WBC 10.7* 10.6*  HGB 13.8 12.6*  HCT 41.3 38.2*   PLT 186 141*  MCV 92.8 93.9  MCH 31.0 31.0  MCHC 33.4 33.0  RDW 12.3 12.6    Chemistries  Recent Labs  Lab 05/22/19 0554 05/23/19 0511  NA 139 136  K 4.1 4.0  CL 104 102  CO2 24 27  GLUCOSE 158* 121*  BUN 13 12  CREATININE 0.74 0.73  CALCIUM 8.7* 8.2*   ------------------------------------------------------------------------------------------------------------------ estimated creatinine clearance is 68.4 mL/min (by C-G formula based on SCr of 0.73 mg/dL). ------------------------------------------------------------------------------------------------------------------ No results for input(s): HGBA1C in the last 72 hours. ------------------------------------------------------------------------------------------------------------------ No results for input(s): CHOL, HDL, LDLCALC, TRIG, CHOLHDL, LDLDIRECT in the last 72 hours. ------------------------------------------------------------------------------------------------------------------ Recent Labs    05/22/19 1355  TSH 0.812   ------------------------------------------------------------------------------------------------------------------ No results for input(s): VITAMINB12, FOLATE, FERRITIN, TIBC, IRON, RETICCTPCT in the last 72 hours.  Coagulation profile No results for input(s): INR, PROTIME in the last 168 hours.  No results for input(s): DDIMER in the last 72 hours.  Cardiac Enzymes No results for input(s): CKMB, TROPONINI, MYOGLOBIN in the last 168 hours.  Invalid input(s): CK ------------------------------------------------------------------------------------------------------------------ Invalid input(s): POCBNP    Assessment & Plan   IMPRESSION AND PLAN: Patient is 85 year old presenting with chest pain and syncope  1.  Chest pain: Patient had a CT scan of the chest PE has been ruled out no other abnormalities noted on CT scan of the chest I have consulted cardiology for further  recommendation Atypical chest pain  2.  Syncope monitor on telemetry obtain echocardiogram of the heart Patient was hypotensive  3.  Hyperglycemia check a hemoglobin A1c  4.  Glaucoma continue eyedrops  5.   Urinary retention continue flomax pt will have foley     Miscellaneous Lovenox     Code Status Orders  (From admission, onward)         Start     Ordered   05/22/19  1414  Do not attempt resuscitation (DNR)  Continuous    Question Answer Comment  In the event of cardiac or respiratory ARREST Do not call a "code blue"   In the event of cardiac or respiratory ARREST Do not perform Intubation, CPR, defibrillation or ACLS   In the event of cardiac or respiratory ARREST Use medication by any route, position, wound care, and other measures to relive pain and suffering. May use oxygen, suction and manual treatment of airway obstruction as needed for comfort.      05/22/19 1413        Code Status History    Date Active Date Inactive Code Status Order ID Comments User Context   05/22/2019 1338 05/22/2019 1413 Full Code [id number]  Auburn Bilberry, MD Inpatient   05/06/2016 1525 05/08/2016 1226 Full Code [id number]  Alford Highland, MD ED   Advance Care Planning Activity           Consults  card  DVT Prophylaxis  Lovenox   Lab Results  Component Value Date   PLT 141 (L) 05/23/2019     Time Spent in minutes Greater than 50% of time spent in care coordination and counseling patient regarding the condition and plan of care.   Auburn Bilberry M.D on 05/23/2019 at 12:53 PM  Between 7am to 6pm - Pager - [phone]  After 6pm go to [email or web address] - Social research officer, government  [employer]   Office  [phone]

## 2019-05-23 NOTE — Care Management CC44 (Signed)
Condition Code 44 Documentation Completed  Patient Details  Name: Bill Cooper MRN: [id number] Date of Birth: Jan 08, 1930   Condition Code 44 given:  Yes Patient signature on Condition Code 44 notice:  Yes Documentation of 2 MD's agreement:  Yes Code 44 added to claim:  Yes    Sachit Gilman, Veronia Beets, LCSW 05/23/2019, 9:10 AM

## 2019-05-24 DIAGNOSIS — R0789 Other chest pain: Secondary | ICD-10-CM | POA: Diagnosis not present

## 2019-05-24 MED ORDER — TAMSULOSIN HCL 0.4 MG PO CAPS: 0.4000 mg | ORAL_CAPSULE | Freq: Every day | ORAL | 0 refills | Status: AC

## 2019-05-24 MED ORDER — MIRABEGRON ER 25 MG PO TB24: 25.0000 mg | ORAL_TABLET | Freq: Every day | ORAL | Status: DC

## 2019-05-24 MED ORDER — CHLORHEXIDINE GLUCONATE CLOTH 2 % EX PADS
6.0000 | MEDICATED_PAD | Freq: Every day | CUTANEOUS | Status: DC
  Administered 2019-05-24: 6 via TOPICAL

## 2019-05-24 MED ORDER — PANTOPRAZOLE SODIUM 40 MG PO TBEC: 40.0000 mg | DELAYED_RELEASE_TABLET | Freq: Every day | ORAL | 11 refills | Status: AC

## 2019-05-24 NOTE — Discharge Instructions (Signed)
Angina ° °Angina is extreme discomfort in the chest, neck, arm, jaw, or back. The discomfort is caused by a lack of blood in the middle layer of the heart wall (myocardium). °There are four types of angina: °· Stable angina. This is triggered by vigorous activity or exercise. It goes away when you rest or take angina medicine. °· Unstable angina. This is a warning sign and can lead to a heart attack (acute coronary syndrome). This is a medical emergency. Symptoms come at rest and last a long time. °· Microvascular angina. This affects the small coronary arteries. Symptoms include feeling tired and being short of breath. °· Prinzmetal or variant angina. This is caused by a tightening (spasm) of the arteries that go to your heart. °What are the causes? °This condition is caused by atherosclerosis. This is the buildup of fat and cholesterol (plaque) in your arteries. The plaque may narrow or block the artery. °Other causes of angina include: °· Sudden tightening of the muscles of the arteries in the heart (coronary spasm). °· Small artery disease (microvascular dysfunction). °· Problems with any of your heart valves (heart valve disease). °· A tear in an artery in your heart (coronary artery dissection). °· Diseases of the heart muscle (cardiomyopathy), or other heart diseases. °What increases the risk? °You are more likely to develop this condition if you have: °· High cholesterol. °· High blood pressure (hypertension). °· Diabetes. °· A family history of heart disease. °· An inactive (sedentary) lifestyle, or you do not exercise enough. °· Depression. °· Had radiation treatment to the left side of your chest. °Other risk factors include: °· Using tobacco. °· Being obese. °· Eating a diet high in saturated fats. °· Being exposed to high stress or triggers of stress. °· Using drugs, such as cocaine. °Women have a greater risk for angina if: °· They are older than 55. °· They have gone through menopause (are  postmenopausal). °What are the signs or symptoms? °Common symptoms of this condition in both men and women may include: °· Chest pain, which may: °? Feel like a crushing or squeezing in the chest, or like a tightness, pressure, fullness, or heaviness in the chest. °? Last for more than a few minutes at a time, or it may stop and come back (recur) over the course of a few minutes. °· Pain in the neck, arm, jaw, or back. °· Unexplained heartburn or indigestion. °· Shortness of breath. °· Nausea. °· Sudden cold sweats. °Women and people with diabetes may have unusual (atypical) symptoms, such as: °· Fatigue. °· Unexplained feelings of nervousness or anxiety. °· Unexplained weakness. °· Dizziness or fainting. °How is this diagnosed? °This condition may be diagnosed based on: °· Your symptoms and medical history. °· Electrocardiogram (ECG) to measure the electrical activity in your heart. °· Blood tests. °· Stress test to look for signs of blockage when your heart is stressed. °· CT angiogram to examine your heart and the blood flow to it. °· Coronary angiogram to check your coronary arteries for blockage. °How is this treated? °Angina may be treated with: °· Medicines to: °? Prevent blood clots and heart attack. °? Relax blood vessels and improve blood flow to the heart (nitrates). °? Reduce blood pressure, improve the pumping action of the heart, and relax blood vessels that are spasming. °? Reduce cholesterol and help treat atherosclerosis. °· A procedure to widen a narrowed or blocked coronary artery (angioplasty). A mesh tube may be placed in a coronary artery to   keep it open (coronary stenting). °· Surgery to allow blood to go around a blocked artery (coronary artery bypass surgery). °Follow these instructions at home: °Medicines °· Take over-the-counter and prescription medicines only as told by your health care provider. °· Do not take the following medicines unless your health care provider approves: °? NSAIDs,  such as ibuprofen or naproxen. °? Vitamin supplements that contain vitamin A, vitamin E, or both. °? Hormone replacement therapy that contains estrogen with or without progestin. °Eating and drinking ° °· Eat a heart-healthy diet. This includes plenty of fresh fruits and vegetables, whole grains, low-fat (lean) protein, and low-fat dairy products. °· Follow instructions from your health care provider about eating or drinking restrictions. °Activity °· Follow an exercise program approved by your health care provider. °· Consider joining a cardiac rehabilitation program. °· Take a break when you feel fatigued. Plan rest periods in your daily activities. °Lifestyle ° °· Do not use any products that contain nicotine or tobacco, such as cigarettes, e-cigarettes, and chewing tobacco. If you need help quitting, ask your health care provider. °· If your health care provider says you can drink alcohol: °? Limit how much you use to: °§ 0-1 drink a day for nonpregnant women. °§ 0-2 drinks a day for men. °? Be aware of how much alcohol is in your drink. In the [place] one drink equals one 12 oz bottle of beer (355 mL), one 5 oz glass of wine (148 mL), or one 1½ oz glass of hard liquor (44 mL). °General instructions °· Maintain a healthy weight. °· Learn to manage stress. °· Keep your vaccinations up to date. Get the flu (influenza) vaccine every year. °· Talk to your health care provider if you feel depressed. Take a depression screening test to see if you are at risk for depression. °· Work with your health care provider to manage other health conditions, such as hypertension or diabetes. °· Keep all follow-up visits as told by your health care provider. This is important. °Get help right away if: °· You have pain in your chest, neck, arm, jaw, or back, and the pain: °? Lasts more than a few minutes. °? Is recurring. °? Is not relieved by taking medicines under the tongue (sublingual nitroglycerin). °? Increases in intensity or  frequency. °· You have a lot of sweating without cause. °· You have unexplained: °? Heartburn or indigestion. °? Shortness of breath or difficulty breathing. °? Nausea or vomiting. °? Fatigue. °? Feelings of nervousness or anxiety. °? Weakness. °· You have sudden light-headedness or dizziness. °· You faint. °These symptoms may represent a serious problem that is an emergency. Do not wait to see if the symptoms will go away. Get medical help right away. Call your local emergency services (911 in the [place] Do not drive yourself to the hospital. °Summary °· Angina is extreme discomfort in the chest, neck, arm, jaw, or back that is caused by a lack of blood in the heart wall. °· There are many symptoms of angina. They include chest pain, unexplained heartburn or indigestion, sudden cold sweats, and fatigue. °· Angina may be treated with behavioral changes, medicine, or surgery. °· Symptoms of angina may represent an emergency. Get medical help right away. Call your local emergency services (911 in the [place] Do not drive yourself to the hospital. °This information is not intended to replace advice given to you by your health care provider. Make sure you discuss any questions you have with your health care provider. °  Document Released: 07/22/2005 Document Revised: 03/09/2018 Document Reviewed: 03/09/2018 °Elsevier Patient Education © 2020 [employer] ° °

## 2019-05-24 NOTE — Progress Notes (Signed)
Foley insertion site is slightly bloody, which per RN report has been the case since insertion. Will continue to monitor. Patient to d/c with the Foley. Wenda Low [facility]

## 2019-05-24 NOTE — TOC Transition Note (Signed)
Transition of Care [facility]) - CM/SW Discharge Note   Patient Details  Name: Bill Cooper MRN: [id number] Date of Birth: [birth year]  [facility]) CM/SW Contact:  Ross Ludwig, LCSW Phone Number: 05/24/2019, 12:31 PM   Clinical Narrative:     CSW spoke with patient and his wife regarding home health agencies.  Patient and family could not remember who they used in the past, CSW provided choice, and they chose [facility].  CSW contacted [facility] and they are able to accept referral.  Patient will be receiving home health PT, RN, and aide.   Final next level of care: [facility] Barriers to Discharge: Barriers Resolved   Patient Goals and CMS Choice Patient states their goals for this hospitalization and ongoing recovery are:: To return back home with home health. CMS Medicare.gov Compare Post Acute Care list provided to:: Patient Choice offered to / list presented to : Spouse, Patient  Discharge Placement  Patient will be discharging back home with home health.                     Discharge Plan and Services In-house Referral: Clinical Social Work              DME Arranged: N/A         HH Arranged: RN, PT, Nurse's Aide [place] Agency: [facility] ([facility]) Date HH Agency Contacted: 05/24/19 Time [place]: 1231 Representative spoke with at [place]: [place] ([facility]) Interventions     Readmission Risk Interventions No flowsheet data found.

## 2019-05-24 NOTE — Progress Notes (Signed)
Patient/wife given Foley supplies and education. All questions answered. Will d/c momentarily. Wenda Low [facility]

## 2019-05-24 NOTE — Progress Notes (Signed)
SATURATION QUALIFICATIONS: (This note is used to comply with regulatory documentation for home oxygen)  Patient Saturations on Room Air at Rest = 95%  Patient Saturations on Room Air while Ambulating = 91%  Patient Saturations on 0 Liters of oxygen while Ambulating = 91% (RA)  Please briefly explain why patient needs home oxygen: N/A Daron Offer

## 2019-05-25 ENCOUNTER — Telehealth: Payer: Self-pay | Admitting: Urology

## 2019-05-25 NOTE — Telephone Encounter (Signed)
Called pt's wife, no answer. Line busy. 1st attempt.

## 2019-05-25 NOTE — Telephone Encounter (Signed)
Patient called back and said they would be available now to talk if you could call them back. They need to know how to take care of the catheter.  Bill Cooper

## 2019-05-25 NOTE — Telephone Encounter (Signed)
Pt's wife has some questions about catheter in for so long.  His appt w/Sam isn't until 10/27th.  She doesn't know how to take care of catheter.

## 2019-05-25 NOTE — Telephone Encounter (Signed)
Called pt's wife she questions what she needs to do in order to care for catheter. Advised her that there is no need to move and or "do" anything to the catheter as long as it is draining into the bag properly and not causing the pt any discomfort. Advised that pt may shower like normal. Wife gave verbal understanding and states that she has no further questions.

## 2019-05-26 NOTE — Discharge Summary (Signed)
[employer] [facility] [place] [facility], 85 y.o., DOB 25-Jun-1930, MRN [id number]. Admission date: 05/22/2019 Discharge Date 05/26/2019 Primary MD Lynnea Ferrier, MD Admitting Physician Auburn Bilberry, MD  Admission Diagnosis  Syncope, unspecified syncope type [R55] Chest pain, unspecified type [R07.9]  Discharge Diagnosis   Active Problems: Chest pain musculoskeletal in nature Syncope due to hypotension Hyperglycemia with no evidence of diabetes Urinary retention Glaucoma  Hospital Course  Bill Cooper  is a 85 y.o. male with a known history of previous DVT not on any anticoagulation currently, GERD who presents with chest pain.  In the center of the chest.  Sharp chest pain in the center of his chest.  Patient was brought to the emergency room in the emergency room he had episode of syncope.  Patient has received morphine and currently sleeping.  Wife at bedside.  She reports that he was doing okay prior to this episode.  Patient was hypotensive on presentation.  Patient was admitted to the hospital for further work-up for his chest pain included a CT scan of the chest he was also seen by cardiology his symptoms were atypical and reproducible.  It was felt to be musculoskeletal in nature.   He was noted to have urinary retention he will follow-up with urology has a Foley in place         Consults  cardiology  Significant Tests:  See full reports for all details    Ct Angio Chest Pe W And/or Wo Contrast  Result Date: 05/22/2019 CLINICAL DATA:  Shortness of breath. Syncopal episode. Acute generalized abdominal pain. Evaluate for pulmonary embolism. EXAM: CT ANGIOGRAPHY CHEST CT ABDOMEN AND PELVIS WITH CONTRAST TECHNIQUE: Multidetector CT imaging of the chest was performed using the standard protocol during bolus administration of intravenous contrast. Multiplanar CT image reconstructions and MIPs were obtained to evaluate the vascular anatomy.  Multidetector CT imaging of the abdomen and pelvis was performed using the standard protocol during bolus administration of intravenous contrast. CONTRAST:  OMNIPAQUE IOHEXOL 350 MG/ML SOLN COMPARISON:  Chest CT-03/20/2012; CT abdomen pelvis-09/18/2016 FINDINGS: CTA CHEST FINDINGS Vascular Findings: There is adequate opacification of the pulmonary arterial system with the main pulmonary artery measuring 337 Hounsfield units. There are no discrete filling defects within the pulmonary arterial tree to suggest pulmonary embolism. Normal caliber the main pulmonary artery. Cardiomegaly. No pericardial effusion. Coronary artery calcifications. No evidence of thoracic aortic aneurysm or dissection. Conventional configuration of the aortic arch. The branch vessels of the aortic arch appear patent throughout their imaged courses. There is a minimal amount of atherosclerotic plaque within the descending thoracic aorta, not resulting in a hemodynamically significant stenosis. Review of the MIP images confirms the above findings. ---------------------------------------------------------------------------------- Nonvascular Findings: Mediastinum/Lymph Nodes: No bulky mediastinal, hilar or axillary lymphadenopathy. Lungs/Pleura: Minimal dependent subpleural ground-glass atelectasis. Ill-defined ground-glass is also seen within the nondependent portion of the right upper lobe (image 29, series 7). No discrete focal airspace opacities. No air bronchograms. No pleural effusion or pneumothorax. The central pulmonary airways appear widely patent. No discrete pulmonary nodules given limitation of the examination. Musculoskeletal: No acute or aggressive osseous abnormalities. Mild (approximately 25%) compression deformity involving the superior endplate of the T11 vertebral body, similar to abdominal CT performed in 2018. Regional soft tissues appear normal. The thyroid gland appears atrophic but without discrete nodule.  _________________________________________________________ CT ABDOMEN and PELVIS FINDINGS Hepatobiliary: Normal hepatic contour. No discrete hepatic lesions. Post cholecystectomy. Similar appearing pneumobilia compatible with prior biliary sphincterotomy.  No ascites. Pancreas: Normal appearance of the pancreas. Spleen: Normal appearance of the spleen. Adrenals/Urinary Tract: There is symmetric enhancement and excretion of the bilateral kidneys. There is an approximately 1.3 cm hypoattenuating cyst arising from the anterior inferior aspect of the right kidney (image 46, series 3). Note is made of several left-sided parapelvic cysts. No definite renal stones this postcontrast examination. No evidence of urinary obstruction. Normal appearance of the bilateral adrenal glands. Normal appearance of the urinary bladder given degree distention however evaluation is degraded secondary streak artifact from the patient's left total hip prosthesis. Stomach/Bowel: Moderate colonic stool burden without evidence of enteric obstruction. Ingested enteric contrast extends to the level of the cecum and ascending colon. The appendix is not visualized compatible provided operative history. Normal appearance of the terminal ileum. Note is made of a prominent jejunal diverticuli within the left mid hemiabdomen which measures approximately 5.3 x 4.4 cm but is without associated wall thickening or adjacent mesenteric stranding, similar to the 09/2016 examination and currently of doubtful clinical concern. Note is also made of a duodenal diverticulum, also without wall thickening or mesenteric stranding (coronal image 49, series 7). No discrete areas of bowel wall thickening. No pneumoperitoneum, pneumatosis or portal venous gas. Vascular/Lymphatic: Atherosclerotic plaque within a normal caliber abdominal aorta. The major branch vessels of the abdominal aorta appear patent on this non CTA examination. No bulky retroperitoneal, mesenteric,  pelvic or inguinal lymph adenopathy. Reproductive: Dystrophic calcifications are seen with the prostate gland. No free fluid the pelvic cul-de-sac. Other: Tiny mesenteric fat containing periumbilical hernia. Musculoskeletal: No acute or aggressive osseous abnormalities. Mild (approximately 5%) compression deformity of the T11 vertebral body, similar to abdominal CT performed in 2018. Mild-to-moderate multilevel lumbar spine DDD, worse at L5-S1 with disc space height loss, endplate irregularity and sclerosis. Post left total hip replacement. Avascular necrosis of the right femoral head with associated mild secondary degenerative change but without articular surface collapse. Review of the MIP images confirms the above findings. IMPRESSION: Chest CTA impression: 1. No acute cardiopulmonary disease. Specifically, no evidence of pulmonary embolism. 2. Cardiomegaly. 3. Coronary artery calcifications. Aortic Atherosclerosis (ICD10-I70.0). Abdomen and pelvic CT impression: 1. No acute findings within the abdomen or pelvis. 2. Redemonstrated duodenal and jejunal diverticuli without associated wall thickening or mesenteric stranding, both of which are of doubtful clinical concern. 3. Post appendectomy and cholecystectomy. 4. Avascular necrosis of the right femoral head with associated mild degenerative change but without articular surface collapse. 5.  Aortic Atherosclerosis (ICD10-I70.0). Electronically Signed   By: Sandi Mariscal M.D.   On: 05/22/2019 07:38   Ct Abdomen Pelvis W Contrast  Result Date: 05/22/2019 CLINICAL DATA:  Shortness of breath. Syncopal episode. Acute generalized abdominal pain. Evaluate for pulmonary embolism. EXAM: CT ANGIOGRAPHY CHEST CT ABDOMEN AND PELVIS WITH CONTRAST TECHNIQUE: Multidetector CT imaging of the chest was performed using the standard protocol during bolus administration of intravenous contrast. Multiplanar CT image reconstructions and MIPs were obtained to evaluate the vascular  anatomy. Multidetector CT imaging of the abdomen and pelvis was performed using the standard protocol during bolus administration of intravenous contrast. CONTRAST:  154mL OMNIPAQUE IOHEXOL 350 MG/ML SOLN COMPARISON:  Chest CT-03/20/2012; CT abdomen pelvis-09/18/2016 FINDINGS: CTA CHEST FINDINGS Vascular Findings: There is adequate opacification of the pulmonary arterial system with the main pulmonary artery measuring 337 Hounsfield units. There are no discrete filling defects within the pulmonary arterial tree to suggest pulmonary embolism. Normal caliber the main pulmonary artery. Cardiomegaly. No pericardial effusion. Coronary artery calcifications. No evidence  of thoracic aortic aneurysm or dissection. Conventional configuration of the aortic arch. The branch vessels of the aortic arch appear patent throughout their imaged courses. There is a minimal amount of atherosclerotic plaque within the descending thoracic aorta, not resulting in a hemodynamically significant stenosis. Review of the MIP images confirms the above findings. ---------------------------------------------------------------------------------- Nonvascular Findings: Mediastinum/Lymph Nodes: No bulky mediastinal, hilar or axillary lymphadenopathy. Lungs/Pleura: Minimal dependent subpleural ground-glass atelectasis. Ill-defined ground-glass is also seen within the nondependent portion of the right upper lobe (image 29, series 7). No discrete focal airspace opacities. No air bronchograms. No pleural effusion or pneumothorax. The central pulmonary airways appear widely patent. No discrete pulmonary nodules given limitation of the examination. Musculoskeletal: No acute or aggressive osseous abnormalities. Mild (approximately 25%) compression deformity involving the superior endplate of the T11 vertebral body, similar to abdominal CT performed in 2018. Regional soft tissues appear normal. The thyroid gland appears atrophic but without discrete nodule.  _________________________________________________________ CT ABDOMEN and PELVIS FINDINGS Hepatobiliary: Normal hepatic contour. No discrete hepatic lesions. Post cholecystectomy. Similar appearing pneumobilia compatible with prior biliary sphincterotomy. No ascites. Pancreas: Normal appearance of the pancreas. Spleen: Normal appearance of the spleen. Adrenals/Urinary Tract: There is symmetric enhancement and excretion of the bilateral kidneys. There is an approximately 1.3 cm hypoattenuating cyst arising from the anterior inferior aspect of the right kidney (image 46, series 3). Note is made of several left-sided parapelvic cysts. No definite renal stones this postcontrast examination. No evidence of urinary obstruction. Normal appearance of the bilateral adrenal glands. Normal appearance of the urinary bladder given degree distention however evaluation is degraded secondary streak artifact from the patient's left total hip prosthesis. Stomach/Bowel: Moderate colonic stool burden without evidence of enteric obstruction. Ingested enteric contrast extends to the level of the cecum and ascending colon. The appendix is not visualized compatible provided operative history. Normal appearance of the terminal ileum. Note is made of a prominent jejunal diverticuli within the left mid hemiabdomen which measures approximately 5.3 x 4.4 cm but is without associated wall thickening or adjacent mesenteric stranding, similar to the 09/2016 examination and currently of doubtful clinical concern. Note is also made of a duodenal diverticulum, also without wall thickening or mesenteric stranding (coronal image 49, series 7). No discrete areas of bowel wall thickening. No pneumoperitoneum, pneumatosis or portal venous gas. Vascular/Lymphatic: Atherosclerotic plaque within a normal caliber abdominal aorta. The major branch vessels of the abdominal aorta appear patent on this non CTA examination. No bulky retroperitoneal, mesenteric,  pelvic or inguinal lymph adenopathy. Reproductive: Dystrophic calcifications are seen with the prostate gland. No free fluid the pelvic cul-de-sac. Other: Tiny mesenteric fat containing periumbilical hernia. Musculoskeletal: No acute or aggressive osseous abnormalities. Mild (approximately 5%) compression deformity of the T11 vertebral body, similar to abdominal CT performed in 2018. Mild-to-moderate multilevel lumbar spine DDD, worse at L5-S1 with disc space height loss, endplate irregularity and sclerosis. Post left total hip replacement. Avascular necrosis of the right femoral head with associated mild secondary degenerative change but without articular surface collapse. Review of the MIP images confirms the above findings. IMPRESSION: Chest CTA impression: 1. No acute cardiopulmonary disease. Specifically, no evidence of pulmonary embolism. 2. Cardiomegaly. 3. Coronary artery calcifications. Aortic Atherosclerosis (ICD10-I70.0). Abdomen and pelvic CT impression: 1. No acute findings within the abdomen or pelvis. 2. Redemonstrated duodenal and jejunal diverticuli without associated wall thickening or mesenteric stranding, both of which are of doubtful clinical concern. 3. Post appendectomy and cholecystectomy. 4. Avascular necrosis of the right femoral head with associated  mild degenerative change but without articular surface collapse. 5.  Aortic Atherosclerosis (ICD10-I70.0). Electronically Signed   By: Simonne Come M.D.   On: 05/22/2019 07:38   Dg Chest Port 1 View  Result Date: 05/22/2019 CLINICAL DATA:  Chest pain. EXAM: PORTABLE CHEST 1 VIEW COMPARISON:  05/07/2016; chest CT-03/20/2012 FINDINGS: Grossly unchanged cardiac silhouette and mediastinal contours given reduced lung volumes and patient positioning. There is persistent thickening of the right paratracheal stripe presumably secondary to prominent vasculature. Mild pulmonary venous congestion without frank evidence of edema. No discrete focal  airspace opacities. No pleural effusion or pneumothorax. No acute osseous abnormalities. IMPRESSION: Suspected pulmonary venous congestion without frank evidence of edema on this AP portable examination. Electronically Signed   By: Simonne Come M.D.   On: 05/22/2019 06:17       Today   Subjective:   Bill Cooper patient doing well Objective:   Blood pressure (!) 114/49, pulse 69, temperature 98 F (36.7 C), temperature source Oral, resp. rate 17, height  (1.778 m), weight 78.9 kg, SpO2 96 %.  . No intake or output data in the 24 hours ending 05/26/19 1439  Exam VITAL SIGNS: Blood pressure (!) 114/49, pulse 69, temperature 98 F (36.7 C), temperature source Oral, resp. rate 17, height  (1.778 m), weight 78.9 kg, SpO2 96 %.  GENERAL:  85 y.o.-year-old patient lying in the bed with no acute distress.  EYES: Pupils equal, round, reactive to light and accommodation. No scleral icterus. Extraocular muscles intact.  HEENT: Head atraumatic, normocephalic. Oropharynx and nasopharynx clear.  NECK:  Supple, no jugular venous distention. No thyroid enlargement, no tenderness.  LUNGS: Normal breath sounds bilaterally, no wheezing, rales,rhonchi or crepitation. No use of accessory muscles of respiration.  CARDIOVASCULAR: S1, S2 normal. No murmurs, rubs, or gallops.  ABDOMEN: Soft, nontender, nondistended. Bowel sounds present. No organomegaly or mass.  EXTREMITIES: No pedal edema, cyanosis, or clubbing.  NEUROLOGIC: Cranial nerves II through XII are intact. Muscle strength 5/5 in all extremities. Sensation intact. Gait not checked.  PSYCHIATRIC: The patient is alert and oriented x 3.  SKIN: No obvious rash, lesion, or ulcer.   Data Review     CBC w Diff:  Lab Results  Component Value Date   WBC 10.6 (H) 05/23/2019   HGB 12.6 (L) 05/23/2019   HGB 13.7 09/17/2016   HCT 38.2 (L) 05/23/2019   HCT 40.4 09/17/2016   PLT 141 (L) 05/23/2019   PLT 188 09/17/2016   LYMPHOPCT 7  05/06/2016   MONOPCT 8 05/06/2016   EOSPCT 0 05/06/2016   BASOPCT 0 05/06/2016   CMP:  Lab Results  Component Value Date   NA 136 05/23/2019   NA 141 09/17/2016   K 4.0 05/23/2019   CL 102 05/23/2019   CO2 27 05/23/2019   BUN 12 05/23/2019   BUN 18 09/17/2016   CREATININE 0.73 05/23/2019   CREATININE 0.91 03/20/2012   PROT 6.2 09/17/2016   ALBUMIN 3.6 09/17/2016   BILITOT 0.5 09/17/2016   ALKPHOS 59 09/17/2016   AST 14 09/17/2016   ALT 12 09/17/2016  .  Micro Results Recent Results (from the past 240 hour(s))  SARS CORONAVIRUS 2 (TAT 6-24 HRS) Nasopharyngeal Nasopharyngeal Swab     Status: None   Collection Time: 05/22/19  8:54 AM   Specimen: Nasopharyngeal Swab  Result Value Ref Range Status   SARS Coronavirus 2 NEGATIVE NEGATIVE Final    Comment: (NOTE) SARS-CoV-2 target nucleic acids are NOT DETECTED. The SARS-CoV-2 RNA  is generally detectable in upper and lower respiratory specimens during the acute phase of infection. Negative results do not preclude SARS-CoV-2 infection, do not rule out co-infections with other pathogens, and should not be used as the sole basis for treatment or other patient management decisions. Negative results must be combined with clinical observations, patient history, and epidemiological information. The expected result is Negative. Fact Sheet for Patients: HairSlick.nohttps://www.fda.gov/media/138098/download Fact Sheet for Healthcare Providers: quierodirigir.comhttps://www.fda.gov/media/138095/download This test is not yet approved or cleared by the [place] FDA and  has been authorized for detection and/or diagnosis of SARS-CoV-2 by FDA under an Emergency Use Authorization (EUA). This EUA will remain  in effect (meaning this test can be used) for the duration of the COVID-19 declaration under Section 56 4(b)(1) of the Act, 21 U.S.C. section 360bbb-3(b)(1), unless the authorization is terminated or revoked sooner. Performed at [facility],  [address] [place], [place] [address]      Code Status History    Date Active Date Inactive Code Status Order ID Comments User Context   05/22/2019 1413 05/24/2019 1746 DNR [id number]  Auburn BilberryPatel, Loye Reininger, MD Inpatient   05/22/2019 1338 05/22/2019 1413 Full Code [id number]  Auburn BilberryPatel, Jamiyla Ishee, MD Inpatient   05/06/2016 1525 05/08/2016 1226 Full Code [id number]  Alford HighlandWieting, Richard, MD ED   Advance Care Planning Activity    Questions for Most Recent Historical Code Status (Order [id number])    Question Answer Comment   In the event of cardiac or respiratory ARREST Do not call a "code blue"    In the event of cardiac or respiratory ARREST Do not perform Intubation, CPR, defibrillation or ACLS    In the event of cardiac or respiratory ARREST Use medication by any route, position, wound care, and other measures to relive pain and suffering. May use oxygen, suction and manual treatment of airway obstruction as needed for comfort.           Follow-up Information    Lynnea FerrierKlein, Bert J III, MD. Go on 05/31/2019.   Specialty: Internal Medicine Why: APPOINTMENT AT 11AM Contact information: [address] [facility] [place] [address] [phone]        Riki AltesStoioff, Scott C, MD. Go on 06/01/2019.   Specialty: Urology Why: for urinary retention....Marland Kitchen.Marland Kitchen.APPOINTMENT AT 9AM Contact information: [address] [place] [address] [phone]           Discharge Medications   Allergies as of 05/24/2019   No Known Allergies     Medication List    TAKE these medications   latanoprost 0.005 % ophthalmic solution Commonly known as: XALATAN INT 1 GTT IN OU QHS Notes to patient: Tonight at bedtime.    mirabegron ER 25 MG Tb24 tablet Commonly known as: MYRBETRIQ Take 1 tablet (25 mg total) by mouth daily. Please continue taking samples as given by primary urologist Notes to patient: Today when home.    multivitamin capsule Take 1 capsule by mouth daily. Notes  to patient: Today when home.    omeprazole 20 MG capsule Commonly known as: PRILOSEC TAKE 1 CAPSULE(20 MG) BY MOUTH EVERY DAY AS NEEDED   pantoprazole 40 MG tablet Commonly known as: Protonix Take 1 tablet (40 mg total) by mouth daily. Notes to patient: Tomorrow at 9A.    tamsulosin 0.4 MG Caps capsule Commonly known as: Flomax Take 1 capsule (0.4 mg total) by mouth daily after supper. Notes to patient: Tonight after dinner.   timolol 0.5 % ophthalmic solution Commonly  known as: TIMOPTIC INT 1 GTT IN OU QD Notes to patient: Today when home.    trospium 20 MG tablet Commonly known as: SANCTURA 1 tablet 1 hour prior to bedtime Notes to patient: Tonight at bedtime.           Total Time in preparing paper work, data evaluation and todays exam - 35 minutes  Auburn Bilberry M.D on 05/26/2019 at 2:39 PM [employer] [facility]   Office  [phone]

## 2019-05-30 DIAGNOSIS — M87051 Idiopathic aseptic necrosis of right femur: Secondary | ICD-10-CM | POA: Insufficient documentation

## 2019-06-01 ENCOUNTER — Encounter: Payer: Self-pay | Admitting: Physician Assistant

## 2019-06-01 ENCOUNTER — Ambulatory Visit: Payer: Self-pay | Admitting: Physician Assistant

## 2019-06-01 ENCOUNTER — Ambulatory Visit (INDEPENDENT_AMBULATORY_CARE_PROVIDER_SITE_OTHER): Payer: Medicare Other | Admitting: Physician Assistant

## 2019-06-01 ENCOUNTER — Other Ambulatory Visit: Payer: Self-pay

## 2019-06-01 VITALS — BP 124/80 | HR 72 | Ht 69.0 in | Wt 170.0 lb

## 2019-06-01 DIAGNOSIS — N401 Enlarged prostate with lower urinary tract symptoms: Secondary | ICD-10-CM | POA: Diagnosis not present

## 2019-06-01 DIAGNOSIS — N138 Other obstructive and reflux uropathy: Secondary | ICD-10-CM | POA: Diagnosis not present

## 2019-06-01 LAB — BLADDER SCAN AMB NON-IMAGING

## 2019-06-01 NOTE — Progress Notes (Signed)
06/01/2019 3:59 PM   Bill Cooper 09-02-1929 [id number]  CC: AUR follow-up for voiding trial  HPI: Bill Cooper is a 85 y.o. male who presents today for catheter removal and voiding trial. He is an established [facility] patient who last saw Dr. Bernardo Heater on 05/05/2019 for BPH with nocturia.  He was started on Myrbetriq 25 mg daily at that visit.  He was hospitalized with chest pain from 10/17-19, during which time he developed acute urinary retention with a bladder scan of 782 mL and a catheter was placed.  He was discharged on tamsulosin 0.4 mg daily.  He is accompanied today by his wife, who contributed to his HPI.  She reports he has had difficulty urinating for several months.  Patient underwent laser PVP with Dr. Bernardo Heater in September 2009.  Voiding trial as follows:  AM Catheter Removal Patient is present today for a catheter removal.  66ml of water was drained from the balloon. A 16FR foley cath was removed from the bladder; no complications were noted. Patient tolerated well.  PM Reevaluation Patient returned to the clinic this afternoon for PVR.  He reports having drunk 58 ounces of fluid at home today.  PVR 278 mL.  He was prompted to void, subsequent PVR 215 mL.  Simple Catheter Placement Due to urinary retention patient is present today for a foley cath placement.  Patient was cleaned and prepped in a sterile fashion with betadine and lidocaine jelly 2% was instilled into the urethra.  A 18 FR coud foley catheter was inserted, urine return was noted  464ml, urine was clear yellow in color.  The balloon was filled with 10cc of sterile water.  An overnight bag was attached for drainage. Patient declined a leg bag.  Patient was given instruction on proper catheter care.  Patient tolerated well and experienced bladder spasm subsequently.  PMH: Past Medical History:  Diagnosis Date  . DVT (deep venous thrombosis) ([place]) 2012   after hip replacement  . GERD (gastroesophageal  reflux disease)    rare  . Wears dentures    full upper, partial lower  . Wears hearing aid    bilateral    Surgical History: Past Surgical History:  Procedure Laterality Date  . APPENDECTOMY    . CATARACT EXTRACTION W/PHACO Right 12/28/2014   Procedure: CATARACT EXTRACTION PHACO AND INTRAOCULAR LENS PLACEMENT (IOC);  Surgeon: Leandrew Koyanagi, MD;  Location: [facility];  Service: Ophthalmology;  Laterality: Right;  . CHOLECYSTECTOMY    . COLONOSCOPY  2006  . KNEE ARTHROSCOPY    . TOTAL HIP ARTHROPLASTY Bilateral   . UPPER GI ENDOSCOPY  2006    Home Medications:  Allergies as of 06/01/2019   No Known Allergies     Medication List       Accurate as of June 01, 2019  3:59 PM. If you have any questions, ask your nurse or doctor.        STOP taking these medications   omeprazole 20 MG capsule Commonly known as: PRILOSEC Stopped by: Debroah Loop, PA-C     TAKE these medications   latanoprost 0.005 % ophthalmic solution Commonly known as: XALATAN INT 1 GTT IN OU QHS   mirabegron ER 25 MG Tb24 tablet Commonly known as: MYRBETRIQ Take 1 tablet (25 mg total) by mouth daily. Please continue taking samples as given by primary urologist   multivitamin capsule Take 1 capsule by mouth daily.   pantoprazole 40 MG tablet Commonly known as: Protonix Take 1  tablet (40 mg total) by mouth daily.   tamsulosin 0.4 MG Caps capsule Commonly known as: Flomax Take 1 capsule (0.4 mg total) by mouth daily after supper.   timolol 0.5 % ophthalmic solution Commonly known as: TIMOPTIC INT 1 GTT IN OU QD   trospium 20 MG tablet Commonly known as: SANCTURA 1 tablet 1 hour prior to bedtime       Allergies:  No Known Allergies  Family History: Family History  Problem Relation Age of Onset  . CAD Father   . Colon cancer Brother   . Liver cancer Brother   . Lung cancer Brother        and colon  . Prostate cancer Brother     Social History:    reports that he has never smoked. He has never used smokeless tobacco. He reports that he does not drink alcohol or use drugs.  ROS: UROLOGY Frequent Urination?: No Hard to postpone urination?: No Burning/pain with urination?: Yes Get up at night to urinate?: No Leakage of urine?: No Urine stream starts and stops?: No Trouble starting stream?: Yes Do you have to strain to urinate?: No Blood in urine?: No Urinary tract infection?: No Sexually transmitted disease?: No Injury to kidneys or bladder?: No Painful intercourse?: No Weak stream?: No Erection problems?: No Penile pain?: No  Gastrointestinal Nausea?: No Vomiting?: No Indigestion/heartburn?: No Diarrhea?: No Constipation?: No  Constitutional Fever: No Night sweats?: No Weight loss?: No Fatigue?: No  Skin Skin rash/lesions?: No Itching?: No  Eyes Blurred vision?: No Double vision?: No  Ears/Nose/Throat Sore throat?: No Sinus problems?: No  Hematologic/Lymphatic Swollen glands?: No Easy bruising?: No  Cardiovascular Leg swelling?: No Chest pain?: No  Respiratory Cough?: No Shortness of breath?: No  Endocrine Excessive thirst?: No  Musculoskeletal Back pain?: No Joint pain?: No  Neurological Headaches?: No Dizziness?: No  Psychologic Depression?: No Anxiety?: No  Physical Exam: BP 124/80   Pulse 72   Ht 5\' 9"  (1.753 m)   Wt 170 lb (77.1 kg)   BMI 25.10 kg/m   Constitutional:  Alert and oriented, no acute distress, nontoxic appearing HEENT: [place], AT Cardiovascular: No clubbing, cyanosis, or edema Respiratory: Normal respiratory effort, no increased work of breathing GI: Abdomen is soft, nontender, nondistended, no abdominal masses GU: Circumcised penis Skin: No rashes, bruises or suspicious lesions Neurologic: Grossly intact, no focal deficits, moving all 4 extremities Psychiatric: Normal mood and affect  Laboratory Data: Results for orders placed or performed in visit on  06/01/19  BLADDER SCAN AMB NON-IMAGING  Result Value Ref Range   Scan Result 06/03/19   BLADDER SCAN AMB NON-IMAGING  Result Value Ref Range   Scan Result    Assessment & Plan:   1. Benign prostatic hyperplasia with urinary obstruction 85 year old male with PMH BPH having undergone laser PVP of the prostate approximately 11 years ago, now with an episode of acute urinary retention associated with hospitalization due to chest pain.  Patient failed voiding trial today; catheter replaced with subsequent drainage of 400 mL clear yellow urine from the bladder.  Patient experienced significant bladder spasm following placement of catheter.  I counseled him to continue Myrbetriq 25 mg daily for management of this.  Given his age, I am not comfortable starting him on anticholinergics at this time.  I had a lengthy conversation with the patient and his wife regarding the natural history of acute urinary retention and the likelihood that this may happen again.  He denies past episodes of a  AUR.  I explained the physiological mechanism of stretch injury of the bladder following episodes of acute urinary retention.  I explained that I would like to give him an additional 10 days to recover from his injury before proceeding with a second voiding trial.  They expressed understanding.  I discussed possible future options if patient fails his second voiding trial in 10 days, including CIC and bladder outlet procedures.  Patient is interested in possible bladder outlet procedures.  If he feels a subsequent voiding trial, we will schedule him for cystoscopy.  Bladder volume can be estimated from recent CT from hospitalization earlier this month.  - BLADDER SCAN AMB NON-IMAGING - BLADDER SCAN AMB NON-IMAGING  Return in about 10 days (around 06/11/2019) for Voiding trial.   I spent 40 min with this patient, of which greater than 50% was spent in counseling and coordination of care with the patient.   Carman ChingSamantha  Dorlis Judice, PA-C  [facility] [address], [address] [place], [place] [address] [phone]

## 2019-06-02 ENCOUNTER — Ambulatory Visit: Payer: Medicare Other | Admitting: Physician Assistant

## 2019-06-02 ENCOUNTER — Encounter: Payer: Self-pay | Admitting: Physician Assistant

## 2019-06-02 ENCOUNTER — Ambulatory Visit (INDEPENDENT_AMBULATORY_CARE_PROVIDER_SITE_OTHER): Payer: Medicare Other | Admitting: Physician Assistant

## 2019-06-02 VITALS — BP 134/68 | HR 73 | Ht 69.0 in | Wt 170.0 lb

## 2019-06-02 DIAGNOSIS — R301 Vesical tenesmus: Secondary | ICD-10-CM

## 2019-06-02 DIAGNOSIS — N401 Enlarged prostate with lower urinary tract symptoms: Secondary | ICD-10-CM

## 2019-06-02 DIAGNOSIS — N138 Other obstructive and reflux uropathy: Secondary | ICD-10-CM | POA: Diagnosis not present

## 2019-06-02 LAB — BLADDER SCAN AMB NON-IMAGING

## 2019-06-02 NOTE — Progress Notes (Signed)
06/02/2019 4:15 PM   [name] 04/27/1930 [id number]  CC: Leaking catheter, bladder and penile pain  HPI: Bill Cooper is a 85 y.o. male who presents today for a leaking catheter and bladder and penile pain.  I saw him yesterday for voiding trial in the setting of acute urinary retention during hospitalization for chest pain.  He failed his voiding trial and I replaced his catheter at that visit.  He is accompanied today by his wife, who contributed to his HPI.  Today, patient reports severe bladder and penile pain with intermittent catheter leakage.  He states he has been unable to sleep for longer than 2 hours at a time secondary to this pain.  He has taken 4000 mg Tylenol since last night for this without relief.  He has continued Myrbetriq 25 mg daily.  He reiterates his desire to proceed with a "Roto-Rooter" procedure for his enlarged prostate.  He and his wife would like to speak with Dr. Lonna CobbStoioff today to discuss his pain and his management plan.  PMH: Past Medical History:  Diagnosis Date  . DVT (deep venous thrombosis) ([facility]) 2012   after hip replacement  . GERD (gastroesophageal reflux disease)    rare  . Wears dentures    full upper, partial lower  . Wears hearing aid    bilateral    Surgical History: Past Surgical History:  Procedure Laterality Date  . APPENDECTOMY    . CATARACT EXTRACTION W/PHACO Right 12/28/2014   Procedure: CATARACT EXTRACTION PHACO AND INTRAOCULAR LENS PLACEMENT (IOC);  Surgeon: Lockie Molahadwick Brasington, MD;  [facility];  Service: Ophthalmology;  Laterality: Right;  . CHOLECYSTECTOMY    . COLONOSCOPY  2006  . KNEE ARTHROSCOPY    . TOTAL HIP ARTHROPLASTY Bilateral   . UPPER GI ENDOSCOPY  2006    Home Medications:  Allergies as of 06/02/2019   No Known Allergies     Medication List       Accurate as of June 02, 2019  4:15 PM. If you have any questions, ask your nurse or doctor.        latanoprost 0.005 %  ophthalmic solution Commonly known as: XALATAN INT 1 GTT IN OU QHS   mirabegron ER 25 MG Tb24 tablet Commonly known as: MYRBETRIQ Take 1 tablet (25 mg total) by mouth daily. Please continue taking samples as given by primary urologist   multivitamin capsule Take 1 capsule by mouth daily.   pantoprazole 40 MG tablet Commonly known as: Protonix Take 1 tablet (40 mg total) by mouth daily.   tamsulosin 0.4 MG Caps capsule Commonly known as: Flomax Take 1 capsule (0.4 mg total) by mouth daily after supper.   timolol 0.5 % ophthalmic solution Commonly known as: TIMOPTIC INT 1 GTT IN OU QD   trospium 20 MG tablet Commonly known as: SANCTURA 1 tablet 1 hour prior to bedtime       Allergies:  No Known Allergies  Family History: Family History  Problem Relation Age of Onset  . CAD Father   . Colon cancer Brother   . Liver cancer Brother   . Lung cancer Brother        and colon  . Prostate cancer Brother     Social History:   reports that he has never smoked. He has never used smokeless tobacco. He reports that he does not drink alcohol or use drugs.  ROS: UROLOGY Frequent Urination?: No Hard to postpone urination?: No Burning/pain with urination?: Yes  Get up at night to urinate?: Yes Leakage of urine?: Yes Urine stream starts and stops?: No Trouble starting stream?: No Do you have to strain to urinate?: No Blood in urine?: No Urinary tract infection?: No Sexually transmitted disease?: No Injury to kidneys or bladder?: No Painful intercourse?: No Weak stream?: No Erection problems?: No Penile pain?: No  Gastrointestinal Nausea?: No Vomiting?: No Indigestion/heartburn?: No Diarrhea?: No Constipation?: No  Constitutional Fever: No Night sweats?: No Weight loss?: No Fatigue?: No  Skin Skin rash/lesions?: No Itching?: No  Eyes Blurred vision?: No Double vision?: No  Ears/Nose/Throat Sore throat?: No Sinus problems?: No  Hematologic/Lymphatic  Swollen glands?: No Easy bruising?: No  Cardiovascular Leg swelling?: No Chest pain?: No  Respiratory Cough?: No Shortness of breath?: No  Endocrine Excessive thirst?: No  Musculoskeletal Back pain?: No Joint pain?: No  Neurological Headaches?: No Dizziness?: No  Psychologic Depression?: No Anxiety?: No  Physical Exam: BP 134/68   Pulse 73   Ht 5\' 9"  (1.753 m)   Wt 170 lb (77.1 kg)   BMI 25.10 kg/m   Constitutional:  Alert and oriented, nontoxic appearing; in severe discomfort, leaning to his right side and moaning HEENT: [place], AT Cardiovascular: No clubbing, cyanosis, or edema Respiratory: Normal respiratory effort, no increased work of breathing GI: Abdomen is soft, nondistended, no abdominal masses, suprapubic tenderness GU: 18 French coud Foley catheter in place draining clear yellow urine, sudden increase in urinary drainage per catheter with slight leaking around the tube during exam Skin: No rashes, bruises or suspicious lesions Neurologic: Grossly intact, no focal deficits, moving all 4 extremities Psychiatric: Normal mood and affect  Laboratory Data: Results for orders placed or performed in visit on 06/02/19  BLADDER SCAN AMB NON-IMAGING  Result Value Ref Range   Scan Result 71mL    Pertinent Imaging: CT abdomen pelvis with contrast, 05/22/2019: CLINICAL DATA:  Shortness of breath. Syncopal episode. Acute generalized abdominal pain. Evaluate for pulmonary embolism.  EXAM: CT ANGIOGRAPHY CHEST  CT ABDOMEN AND PELVIS WITH CONTRAST  TECHNIQUE: Multidetector CT imaging of the chest was performed using the standard protocol during bolus administration of intravenous contrast. Multiplanar CT image reconstructions and MIPs were obtained to evaluate the vascular anatomy. Multidetector CT imaging of the abdomen and pelvis was performed using the standard protocol during bolus administration of intravenous contrast.  CONTRAST:  05/24/2019 OMNIPAQUE  IOHEXOL 350 MG/ML SOLN  COMPARISON:  Chest CT-03/20/2012; CT abdomen pelvis-09/18/2016  FINDINGS: CTA CHEST FINDINGS  Vascular Findings:  There is adequate opacification of the pulmonary arterial system with the main pulmonary artery measuring 337 Hounsfield units. There are no discrete filling defects within the pulmonary arterial tree to suggest pulmonary embolism. Normal caliber the main pulmonary artery.  Cardiomegaly. No pericardial effusion. Coronary artery calcifications.  No evidence of thoracic aortic aneurysm or dissection.  Conventional configuration of the aortic arch. The branch vessels of the aortic arch appear patent throughout their imaged courses. There is a minimal amount of atherosclerotic plaque within the descending thoracic aorta, not resulting in a hemodynamically significant stenosis.  Review of the MIP images confirms the above findings.   ----------------------------------------------------------------------------------  Nonvascular Findings:  Mediastinum/Lymph Nodes: No bulky mediastinal, hilar or axillary lymphadenopathy.  Lungs/Pleura: Minimal dependent subpleural ground-glass atelectasis. Ill-defined ground-glass is also seen within the nondependent portion of the right upper lobe (image 29, series 7). No discrete focal airspace opacities. No air bronchograms. No pleural effusion or pneumothorax. The central pulmonary airways appear widely patent.  No discrete pulmonary nodules given  limitation of the examination.  Musculoskeletal: No acute or aggressive osseous abnormalities. Mild (approximately 25%) compression deformity involving the superior endplate of the T11 vertebral body, similar to abdominal CT performed in 2018. Regional soft tissues appear normal. The thyroid gland appears atrophic but without discrete nodule.  _________________________________________________________  CT ABDOMEN and PELVIS FINDINGS   Hepatobiliary: Normal hepatic contour. No discrete hepatic lesions. Post cholecystectomy. Similar appearing pneumobilia compatible with prior biliary sphincterotomy. No ascites.  Pancreas: Normal appearance of the pancreas.  Spleen: Normal appearance of the spleen.  Adrenals/Urinary Tract: There is symmetric enhancement and excretion of the bilateral kidneys. There is an approximately 1.3 cm hypoattenuating cyst arising from the anterior inferior aspect of the right kidney (image 46, series 3). Note is made of several left-sided parapelvic cysts. No definite renal stones this postcontrast examination. No evidence of urinary obstruction.  Normal appearance of the bilateral adrenal glands.  Normal appearance of the urinary bladder given degree distention however evaluation is degraded secondary streak artifact from the patient's left total hip prosthesis.  Stomach/Bowel: Moderate colonic stool burden without evidence of enteric obstruction. Ingested enteric contrast extends to the level of the cecum and ascending colon. The appendix is not visualized compatible provided operative history. Normal appearance of the terminal ileum.  Note is made of a prominent jejunal diverticuli within the left mid hemiabdomen which measures approximately 5.3 x 4.4 cm but is without associated wall thickening or adjacent mesenteric stranding, similar to the 09/2016 examination and currently of doubtful clinical concern. Note is also made of a duodenal diverticulum, also without wall thickening or mesenteric stranding (coronal image 49, series 7).  No discrete areas of bowel wall thickening. No pneumoperitoneum, pneumatosis or portal venous gas.  Vascular/Lymphatic: Atherosclerotic plaque within a normal caliber abdominal aorta. The major branch vessels of the abdominal aorta appear patent on this non CTA examination.  No bulky retroperitoneal, mesenteric, pelvic or inguinal lymph  adenopathy.  Reproductive: Dystrophic calcifications are seen with the prostate gland. No free fluid the pelvic cul-de-sac.  Other: Tiny mesenteric fat containing periumbilical hernia.  Musculoskeletal: No acute or aggressive osseous abnormalities. Mild (approximately 5%) compression deformity of the T11 vertebral body, similar to abdominal CT performed in 2018. Mild-to-moderate multilevel lumbar spine DDD, worse at L5-S1 with disc space height loss, endplate irregularity and sclerosis. Post left total hip replacement. Avascular necrosis of the right femoral head with associated mild secondary degenerative change but without articular surface collapse.  Review of the MIP images confirms the above findings.  IMPRESSION: Chest CTA impression:  1. No acute cardiopulmonary disease. Specifically, no evidence of pulmonary embolism. 2. Cardiomegaly. 3. Coronary artery calcifications. Aortic Atherosclerosis (ICD10-I70.0).  Abdomen and pelvic CT impression:  1. No acute findings within the abdomen or pelvis. 2. Redemonstrated duodenal and jejunal diverticuli without associated wall thickening or mesenteric stranding, both of which are of doubtful clinical concern. 3. Post appendectomy and cholecystectomy. 4. Avascular necrosis of the right femoral head with associated mild degenerative change but without articular surface collapse. 5.  Aortic Atherosclerosis (ICD10-I70.0).   Electronically Signed   By: Simonne Come M.D.   On: 05/22/2019 07:38  I personally reviewed the images referenced above and the presence of a calcified prostate measuring approximately 4.65 x 4.73 x 3.70 cm.  Assessment: 85 year old male with recent episode of acute urinary retention and failed voiding trial yesterday returns the clinic today with severe bladder and penile pain following the placement of an 56 French coud catheter.  Catheter is in place draining  clear yellow urine today.  Patient  is in severe discomfort, with bladder spasm directly observed during physical exam today.  He is on Myrbetriq 25 mg daily; I do not believe he is a good candidate for anticholinergics due to his age.  Plan:   1. Painful bladder spasm I explained to the patient that bladder spasms can occur with catheterization.  I explained that due to his discomfort, I was willing to remove the catheter, however we would need to come up with a plan for his incomplete bladder emptying while we await further evaluation as below.  Catheter was removed this morning and patient was instructed to return in the afternoon for PVR and CIC teaching.  Catheter Removal Patient is present today for a catheter removal.  5ml of water was drained from the balloon. A 18FR coud foley cath was removed from the bladder no complications were noted . Patient tolerated well. Performed by: Debroah Loop, PA-C Follow up/ Additional notes: Significant relief of discomfort over the course of 10 to 15 minutes.  I advised him and his wife to avoid more Tylenol today, as he has already taken 4 g since yesterday afternoon.  I counseled them that they could use NSAIDs including naproxen for further pain relief as needed.  2. Benign prostatic hyperplasia with urinary obstruction Prostate volume estimated at 53 mL per CT results above.  Patient is interested in considering bladder outlet procedures at this time.  He underwent laser PVP with Dr. Bernardo Heater approximately 11 years ago.  Additionally, they specifically request to speak with Dr. Bernardo Heater regarding his management options.  Due to indwelling catheter intolerance as above, I instructed him on CIC today. Afternoon PVR 54mL, however patient reports limited PO fluid intake today.  Continuous Intermittent Catheterization Due to urinary retention with failed voiding trial and indwelling catheter intolerance patient is present today for a teaching of self I & O Catheterization. Patient was  given detailed verbal and video instructions of self catheterization. Patient was cleaned and prepped in a clean fashion.  With instruction and assistance patient attempted to insert a 14 French coud straight cath from the seated position, a 14 French coud straight cath from the standing position, and a 14 [place] coud SpeedyCath from the seated position.  He was unable to successfully catheterize himself after 3 attempts.  Upon the final attempt, slight bleeding was noted inside the tip of the catheter upon removal.  We elected to terminate CIC teaching at that time with an unsuccessful result. Performed by: Debroah Loop, PA-C  Assisted by: Fonnie Jarvis, CMA Additional Notes: Due to catheter intolerance and failed CIC teaching and in light of normal PVR today, I counseled patient that I would like him to return to the office in the morning for repeat PVR. He was counseled to proceed to the ED immediately if he develops abdominal pain, distention, and/or the inability to urinate in the interim. He expressed understanding. Scheduled him for cystoscopy with bladder outlet procedure evaluation on Friday. - BLADDER SCAN AMB NON-IMAGING  Return in about 1 day (around 06/03/2019) for PVR.   Debroah Loop, PA-C  [facility] [address] [place], [place] [address] [phone]

## 2019-06-03 ENCOUNTER — Ambulatory Visit (INDEPENDENT_AMBULATORY_CARE_PROVIDER_SITE_OTHER): Payer: Medicare Other | Admitting: Physician Assistant

## 2019-06-03 ENCOUNTER — Other Ambulatory Visit: Payer: Self-pay

## 2019-06-03 ENCOUNTER — Encounter: Payer: Self-pay | Admitting: Physician Assistant

## 2019-06-03 VITALS — BP 99/52 | HR 72 | Ht 67.0 in | Wt 170.0 lb

## 2019-06-03 DIAGNOSIS — R339 Retention of urine, unspecified: Secondary | ICD-10-CM | POA: Diagnosis not present

## 2019-06-03 LAB — BLADDER SCAN AMB NON-IMAGING

## 2019-06-03 NOTE — Progress Notes (Signed)
Patient return to clinic this morning for follow-up PVR.  His catheter was removed yesterday morning due to severe bladder spasms in the setting of a failed voiding trial.  He subsequently failed CIC teaching and was hesitant to undergo recatheterization.  PVR 44mL this morning.  He is scheduled for cystoscopy for evaluation of bladder outlet procedures in [place] tomorrow with Dr. Erlene Quan.  I am sending him home today without a catheter due to adequate bladder emptying.  I counseled him on the signs and symptoms of acute urinary retention, including the inability to urinate, lower abdominal pain, lumbar pain, and abdominal distention.  I counseled him to call the clinic immediately if he develops any of the symptoms during routine office hours, 8 AM to 5 PM Monday through Friday.  If outside those hours, I counseled him to proceed to the emergency department. He and his wife both expressed understanding.  Results for orders placed or performed in visit on 06/03/19  BLADDER SCAN AMB NON-IMAGING  Result Value Ref Range   Scan Result 54ml

## 2019-06-04 ENCOUNTER — Other Ambulatory Visit
Admission: RE | Admit: 2019-06-04 | Discharge: 2019-06-04 | Disposition: A | Discharge status: Home / Self Care | Payer: Medicare Other | Attending: Urology | Admitting: Urology

## 2019-06-04 ENCOUNTER — Other Ambulatory Visit: Payer: Self-pay

## 2019-06-04 ENCOUNTER — Ambulatory Visit (INDEPENDENT_AMBULATORY_CARE_PROVIDER_SITE_OTHER): Payer: Medicare Other | Admitting: Urology

## 2019-06-04 ENCOUNTER — Encounter: Payer: Self-pay | Admitting: Urology

## 2019-06-04 VITALS — BP 122/69 | HR 67 | Ht 67.0 in | Wt 170.0 lb

## 2019-06-04 DIAGNOSIS — N401 Enlarged prostate with lower urinary tract symptoms: Secondary | ICD-10-CM | POA: Diagnosis not present

## 2019-06-04 DIAGNOSIS — R339 Retention of urine, unspecified: Secondary | ICD-10-CM

## 2019-06-04 DIAGNOSIS — R351 Nocturia: Secondary | ICD-10-CM | POA: Diagnosis not present

## 2019-06-04 LAB — URINALYSIS, COMPLETE (UACMP) WITH MICROSCOPIC
Bilirubin Urine: NEGATIVE
Glucose, UA: NEGATIVE mg/dL
Ketones, ur: NEGATIVE mg/dL
Nitrite: NEGATIVE
Protein, ur: NEGATIVE mg/dL
Specific Gravity, Urine: 1.025 (ref 1.005–1.030)
Squamous Epithelial / HPF: NONE SEEN (ref 0–5)
pH: 5.5 (ref 5.0–8.0)

## 2019-06-04 LAB — BLADDER SCAN AMB NON-IMAGING

## 2019-06-04 NOTE — Progress Notes (Signed)
06/04/2019 11:19 AM   Bill Cooper 07-Jul-1930 [id number]  Referring provider: Lynnea Ferrier, MD [address] [facility] [place],  [place] [address]  Chief Complaint  Patient presents with  . Urinary Retention    HPI: 85 year old male with a history of BPH, nocturia and more recently urinary retention who returns today for follow-up.  He underwent a voiding trial earlier this week which was ultimately successful.  He was scheduled for cystoscopy to evaluate his prostatic anatomy, however, his urine today is mildly suspicious for possible UTI thus prefer to avoid instrumentation.  Please see previous notes for details.  [name] believes that inability to void started after he started taking Myrbetriq.  He stopped taking this yesterday.  Symptoms today are baseline, poorly controlled.  He does feel he is able to empty his bladder.  PVR today 0.  No dysuria or gross hematuria.   PMH: Past Medical History:  Diagnosis Date  . DVT (deep venous thrombosis) (HCC) 2012   after hip replacement  . GERD (gastroesophageal reflux disease)    rare  . Wears dentures    full upper, partial lower  . Wears hearing aid    bilateral    Surgical History: Past Surgical History:  Procedure Laterality Date  . APPENDECTOMY    . CATARACT EXTRACTION W/PHACO Right 12/28/2014   Procedure: CATARACT EXTRACTION PHACO AND INTRAOCULAR LENS PLACEMENT (IOC);  Surgeon: Lockie Mola, MD;  Location: [facility];  Service: Ophthalmology;  Laterality: Right;  . CHOLECYSTECTOMY    . COLONOSCOPY  2006  . KNEE ARTHROSCOPY    . TOTAL HIP ARTHROPLASTY Bilateral   . UPPER GI ENDOSCOPY  2006    Home Medications:  Allergies as of 06/04/2019   No Known Allergies     Medication List       Accurate as of June 04, 2019 11:19 AM. If you have any questions, ask your nurse or doctor.        STOP taking these medications   trospium 20 MG tablet Commonly known  as: SANCTURA Stopped by: Bill Scotland, MD     TAKE these medications   latanoprost 0.005 % ophthalmic solution Commonly known as: XALATAN INT 1 GTT IN OU QHS   mirabegron ER 25 MG Tb24 tablet Commonly known as: MYRBETRIQ Take 1 tablet (25 mg total) by mouth daily. Please continue taking samples as given by primary urologist   multivitamin capsule Take 1 capsule by mouth daily.   pantoprazole 40 MG tablet Commonly known as: Protonix Take 1 tablet (40 mg total) by mouth daily.   tamsulosin 0.4 MG Caps capsule Commonly known as: Flomax Take 1 capsule (0.4 mg total) by mouth daily after supper.   timolol 0.5 % ophthalmic solution Commonly known as: TIMOPTIC INT 1 GTT IN OU QD       Allergies: No Known Allergies  Family History: Family History  Problem Relation Age of Onset  . CAD Father   . Colon cancer Brother   . Liver cancer Brother   . Lung cancer Brother        and colon  . Prostate cancer Brother     Social History:  reports that he has never smoked. He has never used smokeless tobacco. He reports that he does not drink alcohol or use drugs.  Physical Exam: BP 122/69   Pulse 67   Ht 5\' 7"  (1.702 m)   Wt 170 lb (77.1 kg)   BMI 26.63 kg/m  Constitutional:  Alert and oriented, No acute distress.  Appears slightly younger than stated age.  Accompanied by wife. HEENT: Bill Cooper AT, moist mucus membranes.  Trachea midline, no masses. Cardiovascular: No clubbing, cyanosis, or edema. Respiratory: Normal respiratory effort, no increased work of breathing. Skin: No rashes, bruises or suspicious lesions. Neurologic: Grossly intact, no focal deficits, moving all 4 extremities. Psychiatric: Normal mood and affect.   Urinalysis Component     Latest Ref Rng & Units 06/04/2019  Color, Urine     YELLOW YELLOW  Appearance     CLEAR HAZY (A)  Specific Gravity, Urine     1.005 - 1.030 1.025  pH     5.0 - 8.0 5.5  Glucose, UA     NEGATIVE mg/dL NEGATIVE  Hgb urine  dipstick     NEGATIVE MODERATE (A)  Bilirubin Urine     NEGATIVE NEGATIVE  Ketones, ur     NEGATIVE mg/dL NEGATIVE  Protein     NEGATIVE mg/dL NEGATIVE  Nitrite     NEGATIVE NEGATIVE  Leukocytes,Ua     NEGATIVE TRACE (A)  Squamous Epithelial / LPF     0 - 5 NONE SEEN  WBC, UA     0 - 5 WBC/hpf 21-50  RBC / HPF     0 - 5 RBC/hpf 0-5  Bacteria, UA     NONE SEEN FEW (A)  Mucus      PRESENT    Pertinent Imaging: Results for orders placed or performed in visit on 06/04/19  BLADDER SCAN AMB NON-IMAGING  Result Value Ref Range   Scan Result 30ml     Assessment & Plan:    1. Benign prostatic hyperplasia with nocturia Defer further evaluation and management to Dr. Bernardo Cooper  We will hold off on cystoscopy today given very recent Foley catheter and mildly suspicious urine, sent for culture and will treat as needed  2. History of urinary retention Likely exacerbated by initiation of beta 3 agonist  Advised to stop taking this medication (last dose yesterday) as well as avoid use of anticholinergics.  Bladder emptying appropriately today, PVR 0 which is reassuring.  - BLADDER SCAN AMB NON-IMAGING - Urine Culture; Future  Follow-up with Dr. Marin Olp, MD  [facility] [address], [place], [place] [address] [phone]

## 2019-06-07 ENCOUNTER — Telehealth: Payer: Self-pay | Admitting: *Deleted

## 2019-06-07 LAB — URINE CULTURE: Culture: >=100000 — AB

## 2019-06-07 MED ORDER — NITROFURANTOIN MONOHYD MACRO 100 MG PO CAPS: 100.0000 mg | ORAL_CAPSULE | Freq: Two times a day (BID) | ORAL | 0 refills | Status: DC

## 2019-06-07 NOTE — Telephone Encounter (Addendum)
Patient informed-verbalized understanding. RX sent in to pharmacy as requested.  ----- Message from Hollice Espy, MD sent at 06/07/2019  2:34 PM EST ----- This patient does not fact have a urinary tract infection, likely related to his bout of retention of Foley catheter recently.  Please go ahead and treated with nitrofurantoin 100 mg twice daily for 10 days.  Hollice Espy, MD

## 2019-06-11 ENCOUNTER — Ambulatory Visit: Payer: Medicare Other | Admitting: Physician Assistant

## 2019-06-23 ENCOUNTER — Other Ambulatory Visit: Payer: Self-pay

## 2019-06-23 ENCOUNTER — Ambulatory Visit (INDEPENDENT_AMBULATORY_CARE_PROVIDER_SITE_OTHER): Payer: Medicare Other | Admitting: Podiatry

## 2019-06-23 ENCOUNTER — Ambulatory Visit: Payer: Medicare Other | Admitting: Podiatry

## 2019-06-23 ENCOUNTER — Encounter: Payer: Self-pay | Admitting: Podiatry

## 2019-06-23 DIAGNOSIS — B351 Tinea unguium: Secondary | ICD-10-CM

## 2019-06-23 DIAGNOSIS — M79676 Pain in unspecified toe(s): Secondary | ICD-10-CM

## 2019-06-23 NOTE — Progress Notes (Signed)
He presents today chief complaint of painfully elongated toenails.  Objective: Pulses are palpable no open lesions or wounds.  Toenails are thick yellow dystrophic-like mycotic and painful palpation.  Assessment: Pain in limb secondary to onychomycosis.  Plan: Debridement of toenails 1 through 5 bilateral.

## 2019-06-25 ENCOUNTER — Ambulatory Visit (INDEPENDENT_AMBULATORY_CARE_PROVIDER_SITE_OTHER): Payer: Medicare Other | Admitting: Urology

## 2019-06-25 ENCOUNTER — Other Ambulatory Visit: Payer: Self-pay

## 2019-06-25 ENCOUNTER — Encounter: Payer: Self-pay | Admitting: Urology

## 2019-06-25 VITALS — BP 117/71 | HR 75 | Ht 67.0 in | Wt 165.0 lb

## 2019-06-25 DIAGNOSIS — N401 Enlarged prostate with lower urinary tract symptoms: Secondary | ICD-10-CM | POA: Diagnosis not present

## 2019-06-25 DIAGNOSIS — R351 Nocturia: Secondary | ICD-10-CM | POA: Diagnosis not present

## 2019-06-25 NOTE — Progress Notes (Signed)
06/25/2019 11:59 AM   Bill Cooper 1929/10/20 [id number]  Referring provider: Adin Hector, MD [facility] [place],  [place] [address]  Chief Complaint  Patient presents with  . Follow-up    HPI: 85 y.o. male with history of urinary retention which appeared to be precipitated on initiation of Myrbetriq.  He has subsequently discontinued this medication with resolution of his retention.  He was scheduled for cystoscopy with Dr. Erlene Quan however at that time was noted to have UTI.  His PVR was 0 and cystoscopy was not performed.  He presently states he is voiding with a good stream.  His nocturia has decreased.  He is getting up 3-4 times per night with nighttime volumes ranging between 19-26 ounces.  He states his present nocturia is not interfering with his sleep pattern.  Denies sleep apnea or history of snoring.   PMH: Past Medical History:  Diagnosis Date  . DVT (deep venous thrombosis) ([place]) 2012   after hip replacement  . GERD (gastroesophageal reflux disease)    rare  . Wears dentures    full upper, partial lower  . Wears hearing aid    bilateral    Surgical History: Past Surgical History:  Procedure Laterality Date  . APPENDECTOMY    . CATARACT EXTRACTION W/PHACO Right 12/28/2014   Procedure: CATARACT EXTRACTION PHACO AND INTRAOCULAR LENS PLACEMENT (IOC);  Surgeon: Leandrew Koyanagi, MD;  Location: [place];  Service: Ophthalmology;  Laterality: Right;  . CHOLECYSTECTOMY    . COLONOSCOPY  2006  . KNEE ARTHROSCOPY    . TOTAL HIP ARTHROPLASTY Bilateral   . UPPER GI ENDOSCOPY  2006    Home Medications:  Allergies as of 06/25/2019   No Known Allergies     Medication List       Accurate as of June 25, 2019 11:59 AM. If you have any questions, ask your nurse or doctor.        latanoprost 0.005 % ophthalmic solution Commonly known as: XALATAN INT 1 GTT IN OU QHS   multivitamin capsule Take 1 capsule by  mouth daily.   pantoprazole 40 MG tablet Commonly known as: Protonix Take 1 tablet (40 mg total) by mouth daily.   tamsulosin 0.4 MG Caps capsule Commonly known as: Flomax Take 1 capsule (0.4 mg total) by mouth daily after supper.   timolol 0.5 % ophthalmic solution Commonly known as: TIMOPTIC INT 1 GTT IN OU QD       Allergies: No Known Allergies  Family History: Family History  Problem Relation Age of Onset  . CAD Father   . Colon cancer Brother   . Liver cancer Brother   . Lung cancer Brother        and colon  . Prostate cancer Brother     Social History:  reports that he has never smoked. He has never used smokeless tobacco. He reports that he does not drink alcohol or use drugs.  ROS: UROLOGY Frequent Urination?: No Hard to postpone urination?: No Burning/pain with urination?: No Get up at night to urinate?: Yes Leakage of urine?: No Urine stream starts and stops?: No Trouble starting stream?: No Do you have to strain to urinate?: No Blood in urine?: No Urinary tract infection?: No Sexually transmitted disease?: No Injury to kidneys or bladder?: No Painful intercourse?: No Weak stream?: No Erection problems?: No Penile pain?: No  Gastrointestinal Nausea?: No Vomiting?: No Indigestion/heartburn?: No Diarrhea?: No Constipation?: No  Constitutional Fever: No  Night sweats?: No Weight loss?: No Fatigue?: No  Skin Skin rash/lesions?: No Itching?: No  Eyes Blurred vision?: No Double vision?: No  Ears/Nose/Throat Sore throat?: No Sinus problems?: No  Hematologic/Lymphatic Swollen glands?: No Easy bruising?: No  Cardiovascular Leg swelling?: No Chest pain?: No  Respiratory Cough?: No Shortness of breath?: No  Endocrine Excessive thirst?: No  Musculoskeletal Back pain?: No Joint pain?: No  Neurological Headaches?: No Dizziness?: No  Psychologic Depression?: No Anxiety?: No  Physical Exam: BP 117/71   Pulse 75   Ht 5'  7" (1.702 m)   Wt 165 lb (74.8 kg)   BMI 25.84 kg/m   Constitutional:  Alert and oriented, No acute distress. HEENT: [place] AT, moist mucus membranes.  Trachea midline, no masses. Cardiovascular: No clubbing, cyanosis, or edema. Respiratory: Normal respiratory effort, no increased work of breathing.   Assessment & Plan:    - BPH with lower urinary tract symptoms Urinary retention has resolved.  His nocturia has decreased and is not bothersome.  He will continue tamsulosin.  Follow-up annually and he was instructed to call earlier for worsening voiding symptoms.   Riki Altes, MD  [facility] [address] [place], [place] [address] [phone]

## 2019-07-07 ENCOUNTER — Ambulatory Visit: Payer: Medicare Other | Admitting: Urology

## 2019-08-04 ENCOUNTER — Ambulatory Visit
Admission: RE | Admit: 2019-08-04 | Discharge: 2019-08-04 | Disposition: A | Discharge status: Home / Self Care | Payer: Medicare Other | Source: Ambulatory Visit | Attending: Internal Medicine | Admitting: Internal Medicine

## 2019-08-04 ENCOUNTER — Other Ambulatory Visit: Payer: Self-pay | Admitting: Internal Medicine

## 2019-08-04 ENCOUNTER — Other Ambulatory Visit: Payer: Self-pay

## 2019-08-04 DIAGNOSIS — R7989 Other specified abnormal findings of blood chemistry: Secondary | ICD-10-CM | POA: Insufficient documentation

## 2019-08-04 DIAGNOSIS — R071 Chest pain on breathing: Secondary | ICD-10-CM

## 2019-08-04 LAB — POCT I-STAT CREATININE: Creatinine, Ser: 0.6 mg/dL — ABNORMAL LOW (ref 0.61–1.24)

## 2019-08-04 MED ORDER — IOHEXOL 350 MG/ML SOLN
75.0000 mL | Freq: Once | INTRAVENOUS | Status: AC | PRN
  Administered 2019-08-04: 75 mL via INTRAVENOUS

## 2019-08-10 ENCOUNTER — Other Ambulatory Visit: Payer: Self-pay

## 2019-08-10 ENCOUNTER — Inpatient Hospital Stay
Admission: EM | Admit: 2019-08-10 | Discharge: 2019-09-06 | DRG: 177 | Disposition: E | Payer: PPO | Attending: Internal Medicine | Admitting: Internal Medicine

## 2019-08-10 ENCOUNTER — Encounter: Payer: Self-pay | Admitting: Emergency Medicine

## 2019-08-10 ENCOUNTER — Emergency Department: Payer: PPO

## 2019-08-10 DIAGNOSIS — E871 Hypo-osmolality and hyponatremia: Secondary | ICD-10-CM | POA: Diagnosis present

## 2019-08-10 DIAGNOSIS — Z961 Presence of intraocular lens: Secondary | ICD-10-CM | POA: Diagnosis not present

## 2019-08-10 DIAGNOSIS — R531 Weakness: Secondary | ICD-10-CM

## 2019-08-10 DIAGNOSIS — N390 Urinary tract infection, site not specified: Secondary | ICD-10-CM | POA: Diagnosis present

## 2019-08-10 DIAGNOSIS — I319 Disease of pericardium, unspecified: Secondary | ICD-10-CM | POA: Diagnosis not present

## 2019-08-10 DIAGNOSIS — Z9049 Acquired absence of other specified parts of digestive tract: Secondary | ICD-10-CM | POA: Diagnosis not present

## 2019-08-10 DIAGNOSIS — R52 Pain, unspecified: Secondary | ICD-10-CM

## 2019-08-10 DIAGNOSIS — B952 Enterococcus as the cause of diseases classified elsewhere: Secondary | ICD-10-CM | POA: Diagnosis not present

## 2019-08-10 DIAGNOSIS — I700 Atherosclerosis of aorta: Secondary | ICD-10-CM | POA: Diagnosis present

## 2019-08-10 DIAGNOSIS — Z515 Encounter for palliative care: Secondary | ICD-10-CM | POA: Diagnosis not present

## 2019-08-10 DIAGNOSIS — Z800 Family history of malignant neoplasm of digestive organs: Secondary | ICD-10-CM | POA: Diagnosis not present

## 2019-08-10 DIAGNOSIS — Z8042 Family history of malignant neoplasm of prostate: Secondary | ICD-10-CM | POA: Diagnosis not present

## 2019-08-10 DIAGNOSIS — Z974 Presence of external hearing-aid: Secondary | ICD-10-CM

## 2019-08-10 DIAGNOSIS — J1282 Pneumonia due to coronavirus disease 2019: Secondary | ICD-10-CM | POA: Diagnosis not present

## 2019-08-10 DIAGNOSIS — H409 Unspecified glaucoma: Secondary | ICD-10-CM | POA: Diagnosis present

## 2019-08-10 DIAGNOSIS — J9601 Acute respiratory failure with hypoxia: Secondary | ICD-10-CM | POA: Diagnosis not present

## 2019-08-10 DIAGNOSIS — Z801 Family history of malignant neoplasm of trachea, bronchus and lung: Secondary | ICD-10-CM

## 2019-08-10 DIAGNOSIS — I071 Rheumatic tricuspid insufficiency: Secondary | ICD-10-CM | POA: Diagnosis present

## 2019-08-10 DIAGNOSIS — N400 Enlarged prostate without lower urinary tract symptoms: Secondary | ICD-10-CM | POA: Diagnosis present

## 2019-08-10 DIAGNOSIS — Z96643 Presence of artificial hip joint, bilateral: Secondary | ICD-10-CM | POA: Diagnosis present

## 2019-08-10 DIAGNOSIS — J029 Acute pharyngitis, unspecified: Secondary | ICD-10-CM | POA: Diagnosis not present

## 2019-08-10 DIAGNOSIS — Z9841 Cataract extraction status, right eye: Secondary | ICD-10-CM

## 2019-08-10 DIAGNOSIS — J9621 Acute and chronic respiratory failure with hypoxia: Secondary | ICD-10-CM | POA: Diagnosis not present

## 2019-08-10 DIAGNOSIS — Z79899 Other long term (current) drug therapy: Secondary | ICD-10-CM | POA: Diagnosis not present

## 2019-08-10 DIAGNOSIS — Z8249 Family history of ischemic heart disease and other diseases of the circulatory system: Secondary | ICD-10-CM | POA: Diagnosis not present

## 2019-08-10 DIAGNOSIS — R55 Syncope and collapse: Secondary | ICD-10-CM | POA: Diagnosis not present

## 2019-08-10 DIAGNOSIS — Z86718 Personal history of other venous thrombosis and embolism: Secondary | ICD-10-CM | POA: Diagnosis not present

## 2019-08-10 DIAGNOSIS — E878 Other disorders of electrolyte and fluid balance, not elsewhere classified: Secondary | ICD-10-CM | POA: Diagnosis present

## 2019-08-10 DIAGNOSIS — Z66 Do not resuscitate: Secondary | ICD-10-CM | POA: Diagnosis not present

## 2019-08-10 DIAGNOSIS — R0902 Hypoxemia: Secondary | ICD-10-CM | POA: Diagnosis not present

## 2019-08-10 DIAGNOSIS — R0781 Pleurodynia: Secondary | ICD-10-CM | POA: Diagnosis not present

## 2019-08-10 DIAGNOSIS — R410 Disorientation, unspecified: Secondary | ICD-10-CM | POA: Diagnosis not present

## 2019-08-10 DIAGNOSIS — R079 Chest pain, unspecified: Secondary | ICD-10-CM

## 2019-08-10 DIAGNOSIS — U071 COVID-19: Principal | ICD-10-CM | POA: Diagnosis present

## 2019-08-10 DIAGNOSIS — R918 Other nonspecific abnormal finding of lung field: Secondary | ICD-10-CM | POA: Diagnosis not present

## 2019-08-10 DIAGNOSIS — M1990 Unspecified osteoarthritis, unspecified site: Secondary | ICD-10-CM | POA: Diagnosis present

## 2019-08-10 DIAGNOSIS — K219 Gastro-esophageal reflux disease without esophagitis: Secondary | ICD-10-CM | POA: Diagnosis not present

## 2019-08-10 DIAGNOSIS — R739 Hyperglycemia, unspecified: Secondary | ICD-10-CM | POA: Diagnosis not present

## 2019-08-10 DIAGNOSIS — I361 Nonrheumatic tricuspid (valve) insufficiency: Secondary | ICD-10-CM | POA: Diagnosis not present

## 2019-08-10 DIAGNOSIS — I6523 Occlusion and stenosis of bilateral carotid arteries: Secondary | ICD-10-CM | POA: Diagnosis not present

## 2019-08-10 DIAGNOSIS — Z9181 History of falling: Secondary | ICD-10-CM

## 2019-08-10 DIAGNOSIS — I309 Acute pericarditis, unspecified: Secondary | ICD-10-CM | POA: Diagnosis not present

## 2019-08-10 LAB — HEPATIC FUNCTION PANEL
ALT: 34 U/L (ref 0–44)
AST: 55 U/L — ABNORMAL HIGH (ref 15–41)
Albumin: 3 g/dL — ABNORMAL LOW (ref 3.5–5.0)
Alkaline Phosphatase: 75 U/L (ref 38–126)
Bilirubin, Direct: 0.2 mg/dL (ref 0.0–0.2)
Indirect Bilirubin: 0.6 mg/dL (ref 0.3–0.9)
Total Bilirubin: 0.8 mg/dL (ref 0.3–1.2)
Total Protein: 6.8 g/dL (ref 6.5–8.1)

## 2019-08-10 LAB — BASIC METABOLIC PANEL
Anion gap: 10 (ref 5–15)
BUN: 16 mg/dL (ref 8–23)
CO2: 27 mmol/L (ref 22–32)
Calcium: 8.4 mg/dL — ABNORMAL LOW (ref 8.9–10.3)
Chloride: 95 mmol/L — ABNORMAL LOW (ref 98–111)
Creatinine, Ser: 0.68 mg/dL (ref 0.61–1.24)
GFR calc Af Amer: >60 mL/min (ref >60)
GFR calc non Af Amer: >60 mL/min (ref >60)
Glucose, Bld: 140 mg/dL — ABNORMAL HIGH (ref 70–99)
Potassium: 4.8 mmol/L (ref 3.5–5.1)
Sodium: 132 mmol/L — ABNORMAL LOW (ref 135–145)

## 2019-08-10 LAB — CBC
HCT: 45.3 % (ref 39.0–52.0)
Hemoglobin: 14.9 g/dL (ref 13.0–17.0)
MCH: 29.7 pg (ref 26.0–34.0)
MCHC: 32.9 g/dL (ref 30.0–36.0)
MCV: 90.2 fL (ref 80.0–100.0)
Platelets: 248 10*3/uL (ref 150–400)
RBC: 5.02 MIL/uL (ref 4.22–5.81)
RDW: 12.8 % (ref 11.5–15.5)
WBC: 4.9 10*3/uL (ref 4.0–10.5)
nRBC: 0 % (ref 0.0–0.2)

## 2019-08-10 LAB — TROPONIN I (HIGH SENSITIVITY)
Troponin I (High Sensitivity): 6 ng/L (ref <18)
Troponin I (High Sensitivity): 5 ng/L (ref <18)

## 2019-08-10 LAB — LACTATE DEHYDROGENASE: LDH: 294 U/L — ABNORMAL HIGH (ref 98–192)

## 2019-08-10 LAB — ALT: ALT: 35 U/L (ref 0–44)

## 2019-08-10 LAB — POC SARS CORONAVIRUS 2 AG: SARS Coronavirus 2 Ag: POSITIVE — AB

## 2019-08-10 LAB — FERRITIN: Ferritin: 2094 ng/mL — ABNORMAL HIGH (ref 24–336)

## 2019-08-10 LAB — C-REACTIVE PROTEIN: CRP: 10.1 mg/dL — ABNORMAL HIGH (ref <1.0)

## 2019-08-10 LAB — ABO/RH: ABO/RH(D): A POS

## 2019-08-10 LAB — PROCALCITONIN: Procalcitonin: <0.1 ng/mL

## 2019-08-10 LAB — LIPASE, BLOOD: Lipase: 24 U/L (ref 11–51)

## 2019-08-10 MED ORDER — NITROGLYCERIN 0.4 MG SL SUBL: 0.4000 mg | SUBLINGUAL_TABLET | SUBLINGUAL | Status: DC | PRN

## 2019-08-10 MED ORDER — SODIUM CHLORIDE 0.9 % IV SOLN: 200.0000 mg | Freq: Once | INTRAVENOUS | Status: DC

## 2019-08-10 MED ORDER — SODIUM CHLORIDE 0.9 % IV SOLN: 100.0000 mg | Freq: Every day | INTRAVENOUS | Status: DC

## 2019-08-10 MED ORDER — SODIUM CHLORIDE 0.9 % IV SOLN: INTRAVENOUS | Status: DC

## 2019-08-10 MED ORDER — ZINC SULFATE 220 (50 ZN) MG PO CAPS
220.0000 mg | ORAL_CAPSULE | Freq: Every day | ORAL | Status: DC
  Administered 2019-08-10 – 2019-08-15 (×6): 220 mg via ORAL
  Filled 2019-08-10 (×6): qty 1

## 2019-08-10 MED ORDER — GUAIFENESIN-DM 100-10 MG/5ML PO SYRP
10.0000 mL | ORAL_SOLUTION | ORAL | Status: DC | PRN
  Administered 2019-08-10: 10 mL via ORAL
  Filled 2019-08-10 (×2): qty 10

## 2019-08-10 MED ORDER — VITAMIN D 25 MCG (1000 UNIT) PO TABS
1000.0000 [IU] | ORAL_TABLET | Freq: Every day | ORAL | Status: DC
  Administered 2019-08-10 – 2019-08-11 (×2): 1000 [IU] via ORAL
  Filled 2019-08-10 (×3): qty 1

## 2019-08-10 MED ORDER — PANTOPRAZOLE SODIUM 40 MG PO TBEC: 40.0000 mg | DELAYED_RELEASE_TABLET | Freq: Every day | ORAL | Status: DC

## 2019-08-10 MED ORDER — LATANOPROST 0.005 % OP SOLN
1.0000 [drp] | Freq: Every day | OPHTHALMIC | Status: DC
  Administered 2019-08-11 – 2019-08-14 (×4): 1 [drp] via OPHTHALMIC
  Filled 2019-08-10: qty 2.5

## 2019-08-10 MED ORDER — ASPIRIN EC 81 MG PO TBEC
81.0000 mg | DELAYED_RELEASE_TABLET | Freq: Every day | ORAL | Status: DC
  Administered 2019-08-11 – 2019-08-15 (×5): 81 mg via ORAL
  Filled 2019-08-10 (×7): qty 1

## 2019-08-10 MED ORDER — ONDANSETRON HCL 4 MG PO TABS
4.0000 mg | ORAL_TABLET | Freq: Four times a day (QID) | ORAL | Status: DC | PRN
  Filled 2019-08-10: qty 1

## 2019-08-10 MED ORDER — SODIUM CHLORIDE 0.9 % IV SOLN
200.0000 mg | Freq: Once | INTRAVENOUS | Status: AC
  Administered 2019-08-10: 200 mg via INTRAVENOUS
  Filled 2019-08-10: qty 40

## 2019-08-10 MED ORDER — ADULT MULTIVITAMIN W/MINERALS CH
1.0000 | ORAL_TABLET | Freq: Every day | ORAL | Status: DC
  Administered 2019-08-11 – 2019-08-15 (×5): 1 via ORAL
  Filled 2019-08-10 (×6): qty 1

## 2019-08-10 MED ORDER — TRAZODONE HCL 50 MG PO TABS
25.0000 mg | ORAL_TABLET | Freq: Every evening | ORAL | Status: DC | PRN
  Filled 2019-08-10: qty 1
  Filled 2019-08-10: qty 0.5

## 2019-08-10 MED ORDER — NITROGLYCERIN 0.4 MG/SPRAY TL SOLN: 1.0000 | Status: DC | PRN

## 2019-08-10 MED ORDER — ASCORBIC ACID 500 MG PO TABS
500.0000 mg | ORAL_TABLET | Freq: Every day | ORAL | Status: DC
  Administered 2019-08-10 – 2019-08-11 (×2): 500 mg via ORAL
  Filled 2019-08-10 (×2): qty 1

## 2019-08-10 MED ORDER — HYDROCOD POLST-CPM POLST ER 10-8 MG/5ML PO SUER
5.0000 mL | Freq: Two times a day (BID) | ORAL | Status: DC | PRN
  Administered 2019-08-13 – 2019-08-14 (×3): 5 mL via ORAL
  Filled 2019-08-10 (×3): qty 5

## 2019-08-10 MED ORDER — ONDANSETRON HCL 4 MG/2ML IJ SOLN
4.0000 mg | Freq: Four times a day (QID) | INTRAMUSCULAR | Status: DC | PRN
  Administered 2019-08-15: 4 mg via INTRAVENOUS
  Filled 2019-08-10: qty 2

## 2019-08-10 MED ORDER — FAMOTIDINE 20 MG PO TABS
20.0000 mg | ORAL_TABLET | Freq: Two times a day (BID) | ORAL | Status: DC
  Administered 2019-08-10 – 2019-08-11 (×2): 20 mg via ORAL
  Filled 2019-08-10 (×2): qty 1

## 2019-08-10 MED ORDER — TIMOLOL MALEATE 0.5 % OP SOLN
1.0000 [drp] | Freq: Every day | OPHTHALMIC | Status: DC
  Administered 2019-08-11 – 2019-08-15 (×5): 1 [drp] via OPHTHALMIC
  Filled 2019-08-10: qty 5

## 2019-08-10 MED ORDER — ENOXAPARIN SODIUM 40 MG/0.4ML ~~LOC~~ SOLN
40.0000 mg | SUBCUTANEOUS | Status: DC
  Administered 2019-08-10 – 2019-08-14 (×5): 40 mg via SUBCUTANEOUS
  Filled 2019-08-10 (×6): qty 0.4

## 2019-08-10 MED ORDER — GUAIFENESIN ER 600 MG PO TB12
600.0000 mg | ORAL_TABLET | Freq: Two times a day (BID) | ORAL | Status: DC
  Administered 2019-08-10 – 2019-08-15 (×10): 600 mg via ORAL
  Filled 2019-08-10 (×11): qty 1

## 2019-08-10 MED ORDER — MULTIVITAMINS PO CAPS: 1.0000 | ORAL_CAPSULE | Freq: Every day | ORAL | Status: DC

## 2019-08-10 MED ORDER — MORPHINE SULFATE (PF) 2 MG/ML IV SOLN
2.0000 mg | INTRAVENOUS | Status: DC | PRN
  Administered 2019-08-14: 2 mg via INTRAVENOUS
  Filled 2019-08-10: qty 1

## 2019-08-10 MED ORDER — ACETAMINOPHEN 325 MG PO TABS
650.0000 mg | ORAL_TABLET | Freq: Four times a day (QID) | ORAL | Status: DC | PRN
  Administered 2019-08-14: 650 mg via ORAL
  Filled 2019-08-10: qty 2

## 2019-08-10 MED ORDER — TAMSULOSIN HCL 0.4 MG PO CAPS
0.4000 mg | ORAL_CAPSULE | Freq: Every day | ORAL | Status: DC
  Administered 2019-08-11 – 2019-08-14 (×5): 0.4 mg via ORAL
  Filled 2019-08-10 (×6): qty 1

## 2019-08-10 MED ORDER — SODIUM CHLORIDE 0.9% FLUSH
3.0000 mL | Freq: Once | INTRAVENOUS | Status: AC
  Administered 2019-08-10: 3 mL via INTRAVENOUS

## 2019-08-10 MED ORDER — DEXAMETHASONE SODIUM PHOSPHATE 4 MG/ML IJ SOLN
6.0000 mg | Freq: Every day | INTRAMUSCULAR | Status: DC
  Administered 2019-08-11 – 2019-08-14 (×5): 6 mg via INTRAVENOUS
  Filled 2019-08-10 (×3): qty 2
  Filled 2019-08-10: qty 1.5

## 2019-08-10 MED ORDER — DEXAMETHASONE SODIUM PHOSPHATE 10 MG/ML IJ SOLN
8.0000 mg | Freq: Once | INTRAMUSCULAR | Status: AC
  Administered 2019-08-10: 8 mg via INTRAVENOUS
  Filled 2019-08-10: qty 1

## 2019-08-10 MED ORDER — SODIUM CHLORIDE 0.9 % IV SOLN
100.0000 mg | Freq: Every day | INTRAVENOUS | Status: AC
  Administered 2019-08-11 – 2019-08-14 (×4): 100 mg via INTRAVENOUS
  Filled 2019-08-10 (×4): qty 100

## 2019-08-10 MED ORDER — MAGNESIUM HYDROXIDE 400 MG/5ML PO SUSP: 30.0000 mL | Freq: Every day | ORAL | Status: DC | PRN

## 2019-08-10 NOTE — ED Notes (Signed)
Valerie,RN made aware of pt O2 sat 83 on RA. Pt placed on 6L O2 via [place]. Pt 90% on 6L O2.Elige Radon at pt side.

## 2019-08-10 NOTE — ED Notes (Signed)
RT at bedside.

## 2019-08-10 NOTE — ED Triage Notes (Signed)
fam member says he passed out again this am.  Says he has been doingthis since before christmas.  Says he has been to doctor for this.   She says she called dr Graciela Husbands and was told to bring him here.  She also says he has decreased strength.

## 2019-08-10 NOTE — ED Provider Notes (Signed)
[facility] Emergency Department Provider Note  ____________________________________________   First MD Initiated Contact with Patient 08/24/2019 1653     (approximate)  I have reviewed the triage vital signs and the nursing notes.  History  Chief Complaint Loss of Consciousness    HPI Bill Cooper is a 86 y.o. male PMHx as below, who presents for significant generalized weakness as well as syncope.   History primarily obtained by wife via phone.   Per wife, patient has had ongoing issues with dry cough and chest pain since October. Was admitted from 10/17 to 10/19, echocardiogram EF 50-55%, normal LV function. Due to ongoing symptoms, he had an outpatient CT done on 08/04/19 for work up of this, which was negative for PE, but suggestive of possible pericarditis. Started on prednisone, but per wife has been worsening since then. She reports he is significantly weak, which is unlike him and has been progressively worsening over the last week. Had a syncopal episode on Sunday, witnessed, fell to his knees in front of her, but no associated trauma, head or extremity injury. Then had an unwitnessed episode earlier today, prompting evaluation.   Patient is a difficult historian, but does confirm the two LOC episodes. Complains of ongoing intermittent chest pain and SOB/dry cough since October, but cannot characterize further.    Past Medical Hx Past Medical History:  Diagnosis Date  . DVT (deep venous thrombosis) (HCC) 2012   after hip replacement  . GERD (gastroesophageal reflux disease)    rare  . Wears dentures    full upper, partial lower  . Wears hearing aid    bilateral    Problem List Patient Active Problem List   Diagnosis Date Noted  . Avascular necrosis of bone of hip, right (HCC) 05/30/2019  . Syncope and collapse 05/22/2019  . Nocturia 03/16/2019  . Bilateral lower abdominal pain 07/16/2018  . Change in bowel function 07/16/2018  .  Arthritis 11/18/2016  . Chest pain with high risk for cardiac etiology 10/18/2016  . 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 Sepsis (HCC) 05/06/2016  . History of DVT (deep vein thrombosis) 10/11/2015  . Peripheral tear of medial meniscus as current injury 03/15/2014  . Benign prostatic hyperplasia with lower urinary tract symptoms 01/25/2013    Past Surgical Hx Past Surgical History:  Procedure Laterality Date  . APPENDECTOMY    . CATARACT EXTRACTION W/PHACO Right 12/28/2014   Procedure: CATARACT EXTRACTION PHACO AND INTRAOCULAR LENS PLACEMENT (IOC);  Surgeon: Lockie Mola, MD;  Location: [facility];  Service: Ophthalmology;  Laterality: Right;  . CHOLECYSTECTOMY    . COLONOSCOPY  2006  . KNEE ARTHROSCOPY    . TOTAL HIP ARTHROPLASTY Bilateral   . UPPER GI ENDOSCOPY  2006    Medications Prior to Admission medications   Medication Sig Start Date End Date Taking? Authorizing Provider  latanoprost (XALATAN) 0.005 % ophthalmic solution INT 1 GTT IN OU QHS 02/19/18   [provider]  Multiple Vitamin (MULTIVITAMIN) capsule Take 1 capsule by mouth daily.    [provider]  pantoprazole (PROTONIX) 40 MG tablet Take 1 tablet (40 mg total) by mouth daily. 05/24/19 05/23/20  Auburn Bilberry, MD  tamsulosin (FLOMAX) 0.4 MG CAPS capsule Take 1 capsule (0.4 mg total) by mouth daily after supper. 05/24/19   Auburn Bilberry, MD  timolol (TIMOPTIC) 0.5 % ophthalmic solution INT 1 GTT IN OU QD 05/11/18   [provider]    Allergies Patient has no known  allergies.  Family Hx Family History  Problem Relation Age of Onset  . CAD Father   . Colon cancer Brother   . Liver cancer Brother   . Lung cancer Brother        and colon  . Prostate cancer Brother     Social Hx Social History   Tobacco Use  . Smoking status: Never Smoker  . Smokeless tobacco: Never Used  Substance Use Topics  . Alcohol use: No    Alcohol/week: 0.0 standard drinks  .  Drug use: No     Review of Systems  Constitutional: Negative for fever, chills. + weakness, fatigue Eyes: Negative for visual changes. ENT: Negative for sore throat. Cardiovascular: + for chest pain. Respiratory: + for shortness of breath and cough. Gastrointestinal: Negative for nausea, vomiting.  Genitourinary: Negative for dysuria. Musculoskeletal: Negative for leg swelling. Skin: Negative for rash. Neurological: Negative for for headaches. + syncope   Physical Exam  Vital Signs: ED Triage Vitals  Enc Vitals Group     BP 09/05/2019 1413 (!) 109/47     Pulse Rate 08/16/2019 1413 80     Resp 08/30/2019 1413 20     Temp 08/16/2019 1413 98.7 F (37.1 C)     Temp Source 09/05/2019 1413 Oral     SpO2 09/04/2019 1413 90 %     Weight 08/08/2019 1415 160 lb (72.6 kg)     Height 08/21/2019 1415 5\' 6"  (1.676 m)     Head Circumference --      Peak Flow --      Pain Score 08/25/2019 1414 10     Pain Loc --      Pain Edu? --      Excl. in GC? --     Constitutional: Awake. Appears fatigued and SOB. Head: Normocephalic. Atraumatic. Eyes: Conjunctivae clear. Sclera anicteric. Nose: No congestion. No rhinorrhea. Mouth/Throat: Wearing mask.  Neck: No stridor.   Cardiovascular: Normal rate, regular rhythm. Extremities well perfused. Respiratory: RR mid 20s with increased WOB. Oxygen mid 80s despite 6 L [place].  Gastrointestinal: Soft. Non-tender. Non-distended.  Musculoskeletal: No lower extremity edema. No deformities. Neurologic:  Normal speech and language. No gross focal neurologic deficits are appreciated.  Skin: Skin is warm, dry and intact. No rash noted. Psychiatric: Mood and affect are appropriate for situation.  EKG  Personally reviewed.   Rate: 79 Rhythm: sinus Axis: leftward Intervals: WNL Non-specific T wave changes No acute ischemic changes No STEMI    Radiology  CXR: IMPRESSION:  Bilateral pulmonary infiltrates, left side greater than right.  Findings most consistent  with pneumonia.    Procedures  Procedure(s) performed (including critical care):  .Critical Care Performed by: 10/08/19., MD Authorized by: Miguel Aschoff., MD   Critical care provider statement:    Critical care time (minutes):  45   Critical care was necessary to treat or prevent imminent or life-threatening deterioration of the following conditions:  Respiratory failure   Critical care was time spent personally by me on the following activities:  Discussions with consultants, evaluation of patient's response to treatment, examination of patient, ordering and performing treatments and interventions, ordering and review of laboratory studies, ordering and review of radiographic studies, pulse oximetry, re-evaluation of patient's condition, obtaining history from patient or surrogate and review of old charts     Initial Impression / Assessment and Plan / ED Course  86 y.o. male who presents to the ED for generalized weakness, fatigue, syncope, as above.  Ddx:  anemia, arrhythmia, cardiogenic syncope, infection, dehydration, electrolyte abnormality  Patient hypoxic despite NRB, advanced to HFNC. Work up reveals COVID positive, will initiat Decadron and consult pharmacy for remdesivir. Troponin negative, EKG w/o evidence of arrhythmia or acute ischemia. No anemia. Very mild hyponatremia/hypochloremia.  Inflammatory markers ordered. Will admit, discussed with hospitalist. Wife updated on results and plan of care.    Final Clinical Impression(s) / ED Diagnosis  Final diagnoses:  Weakness  COVID-19  Hypoxia       Note:  This document was prepared using Dragon voice recognition software and may include unintentional dictation errors.   Lilia Pro., MD 08/23/2019 [phone]

## 2019-08-10 NOTE — H&P (Signed)
[place] [facility]   PATIENT NAME: Bill Cooper    MR#:  [id number]  DATE OF BIRTH:  [birth year]  DATE OF ADMISSION:  09/05/2019  PRIMARY CARE PHYSICIAN: Lynnea Ferrier, MD   REQUESTING/REFERRING PHYSICIAN: Paschal Dopp, MD  CHIEF COMPLAINT:   Chief Complaint  Patient presents with  . Loss of Consciousness    HISTORY OF PRESENT ILLNESS:  Bill Cooper  is a 86 y.o. male with a known history of GERD, BPH, glaucoma and history of postsurgical DVT, who presented to the emergency room with acute onset of generalized weakness and significant fatigue with 2 episodes of syncope over the last week and a half.  He has been having dry cough and intermittent chest pain for the last couple months.  He had a chest CTA on an outpatient basis on 08/04/2019 and that showed possible pericarditis for which she was placed on p.o. prednisone and was negative for PE.  He continues to have ongoing worsening dyspnea as well as dry cough with intermittent left lower parasternal chest pain felt as a nagging pain with no radiation.  He denies any loss of taste or smell.  No fever or chills.  He denied any nausea or vomiting or diarrhea.  His syncope episodes occurred on Sunday and earlier today.  Upon presentation to the emergency room, blood pressure was 109/47 with pulse oximetry of 90% on room air and that dropped to 83% and he was tachypneic to 36.  He required high flow nasal cannula at 15 L/min to raise his pulse oximetry to 94 and later 91%.  CMP was remarkable for mild hyponatremia and hypochloremia and CBC was within normal.  His COVID-19 antigen came back positive.  Two-view chest x-ray showed bilateral pulmonary infiltrates, left side greater than right, consistent with pneumonia.  EKG showed normal sinus rhythm with rate of 79 with flattened T waves anterolaterally.  The patient was given a 8 mg of IV Decadron and was started on IV remdesivir.  He will be admitted to stepdown unit for  further evaluation and management.   PAST MEDICAL HISTORY:   Past Medical History:  Diagnosis Date  . DVT (deep venous thrombosis) (HCC) 2012   after hip replacement  . GERD (gastroesophageal reflux disease)    rare  . Wears dentures    full upper, partial lower  . Wears hearing aid    bilateral    PAST SURGICAL HISTORY:   Past Surgical History:  Procedure Laterality Date  . APPENDECTOMY    . CATARACT EXTRACTION W/PHACO Right 12/28/2014   Procedure: CATARACT EXTRACTION PHACO AND INTRAOCULAR LENS PLACEMENT (IOC);  Surgeon: Lockie Mola, MD;  Location: [facility];  Service: Ophthalmology;  Laterality: Right;  . CHOLECYSTECTOMY    . COLONOSCOPY  2006  . KNEE ARTHROSCOPY    . TOTAL HIP ARTHROPLASTY Bilateral   . UPPER GI ENDOSCOPY  2006    SOCIAL HISTORY:   Social History   Tobacco Use  . Smoking status: Never Smoker  . Smokeless tobacco: Never Used  Substance Use Topics  . Alcohol use: No    Alcohol/week: 0.0 standard drinks    FAMILY HISTORY:   Family History  Problem Relation Age of Onset  . CAD Father   . Colon cancer Brother   . Liver cancer Brother   . Lung cancer Brother        and colon  . Prostate cancer Brother     DRUG ALLERGIES:  No Known  Allergies  REVIEW OF SYSTEMS:   ROS As per history of present illness. All pertinent systems were reviewed above. Constitutional,  HEENT, cardiovascular, respiratory, GI, GU, musculoskeletal, neuro, psychiatric, endocrine,  integumentary and hematologic systems were reviewed and are otherwise  negative/unremarkable except for positive findings mentioned above in the HPI.   MEDICATIONS AT HOME:   Prior to Admission medications   Medication Sig Start Date End Date Taking? Authorizing Provider  latanoprost (XALATAN) 0.005 % ophthalmic solution INT 1 GTT IN OU QHS 02/19/18   [provider]  Multiple Vitamin (MULTIVITAMIN) capsule Take 1 capsule by mouth daily.    [provider]  pantoprazole (PROTONIX) 40 MG tablet Take 1 tablet (40 mg total) by mouth daily. 05/24/19 05/23/20  Auburn Bilberry, MD  tamsulosin (FLOMAX) 0.4 MG CAPS capsule Take 1 capsule (0.4 mg total) by mouth daily after supper. 05/24/19   Auburn Bilberry, MD  timolol (TIMOPTIC) 0.5 % ophthalmic solution INT 1 GTT IN OU QD 05/11/18   [provider]      VITAL SIGNS:  Blood pressure 137/74, pulse 92, temperature 99.4 F (37.4 C), temperature source Oral, resp. rate (!) 26, height 5\' 6"  (1.676 m), weight 72.6 kg, SpO2 91 %.  PHYSICAL EXAMINATION:  Physical Exam  GENERAL:  86 y.o.-year-old Caucasian male patient lying in the bed with mild respiratory distress on high flow nasal cannula with conversational dyspnea. EYES: Pupils equal, round, reactive to light and accommodation. No scleral icterus. Extraocular muscles intact.  HEENT: Head atraumatic, normocephalic. Oropharynx and nasopharynx clear.  NECK:  Supple, no jugular venous distention. No thyroid enlargement, no tenderness.  LUNGS: Diminished bibasal breath sounds with [place] and midlung zone  crackles. CARDIOVASCULAR: Regular rate and rhythm, S1, S2 normal. No murmurs, rubs, or gallops.  ABDOMEN: Soft, nondistended, nontender. Bowel sounds present. No organomegaly or mass.  EXTREMITIES: No pedal edema, cyanosis, or clubbing.  NEUROLOGIC: Cranial nerves II through XII are intact. Muscle strength 5/5 in all extremities. Sensation intact. Gait not checked.  PSYCHIATRIC: The patient is alert and oriented x 3.  Normal affect and good eye contact. SKIN: No obvious rash, lesion, or ulcer.   LABORATORY PANEL:   CBC Recent Labs  Lab 08/28/2019 1424  WBC 4.9  HGB 14.9  HCT 45.3  PLT 248   ------------------------------------------------------------------------------------------------------------------  Chemistries  Recent Labs  Lab 08/27/2019 1424  NA 132*  K 4.8  CL 95*  CO2 27  GLUCOSE 140*  BUN 16    CREATININE 0.68  CALCIUM 8.4*  ALT 35   ------------------------------------------------------------------------------------------------------------------  Cardiac Enzymes No results for input(s): TROPONINI in the last 168 hours. ------------------------------------------------------------------------------------------------------------------  RADIOLOGY:  DG Chest 2 View  Result Date: 08/16/2019 CLINICAL DATA:  Syncope. EXAM: CHEST - 2 VIEW COMPARISON:  CT 08/04/2019.  Chest x-ray 05/22/2019. FINDINGS: Mediastinum is normal. Heart size normal. Bilateral pulmonary infiltrates, left side greater than right. No pleural effusion or pneumothorax. Degenerative changes scoliosis thoracic spine. IMPRESSION: Bilateral pulmonary infiltrates, left side greater than right. Findings most consistent with pneumonia. Electronically Signed   By: 05/24/2019  Register   On: 08/23/2019 15:29      IMPRESSION AND PLAN:   1.  Acute hypoxemic respiratory failure secondary to COVID-19. -The patient will be admitted to a medically monitored isolation bed. -O2 protocol will be followed to keep O2 saturation above 93.   2.  Multifocal pneumonia, generalized weakness and syncope secondary to COVID-19. -The patient will be admitted to an isolation monitored bed with droplet and contact precautions. -  Given multifocal pneumonia we will empirically place the patient on IV Rocephin and Zithromax for possible bacterial superinfection only with elevated Procalcitonin. -The patient will be placed on scheduled Mucinex and as needed Tussionex. -We will avoid nebulization as much as we can, give bronchodilator MDI if needed, and with deterioration of oxygenation try to avoid BiPAP/CPAP if possible.    -Will obtain sputum Gram stain culture and sensitivity and follow blood cultures. -O2 protocol will be followed. -We will follow CRP, ferritin, LDH and D-dimer. -Will follow manual differential for ANC/ALC ratio as well as follow  troponin I and daily CBC with manual differential and CMP. - Will place the patient on IV Remdisivir and IV steroid therapy with Decadron with elevated inflammatory markers. -The patient will be placed on vitamin D3, vitamin C, zinc sulfate, p.o. Pepcid and aspirin. -We will await CRP to consider Actemra. -For his syncope we will check his orthostatics every 12 hours and obtain a 2D echo and carotid Doppler. -For his generalized weakness we will obtain physical therapy consult.  3.  Chest pain.  This is likely pleuritic.  We will follow serial troponin I's.  The patient will be placed on steroid therapy as mentioned above.  We will add as needed sublingual nitroglycerin and morphine sulfate.  4.  GERD.  We will continue PPI.  5.  BPH.  We will continue his Flomax.  6.  Glaucoma.  We will continue his ophthalmic gtt.'s.  7.  DVT prophylaxis.  Subcutaneous Lovenox.   All the records are reviewed and case discussed with ED provider. The plan of care was discussed in details with the patient (and family). I answered all questions. The patient agreed to proceed with the above mentioned plan. Further management will depend upon hospital course.   CODE STATUS: Discussed the CODE STATUS with the patient and he clearly indicated that he desires to be full code.  Of note he has been DNR in the past.  TOTAL TIME TAKING CARE OF THIS PATIENT: 60 minutes.    Christel Mormon M.D on 08/15/2019 at 7:20 PM  [facility]   From 7 PM-7 AM, contact night-coverage [email or web address]  CC: Primary care physician; Adin Hector, MD   Note: This dictation was prepared with Dragon dictation along with smaller phrase technology. Any transcriptional errors that result from this process are unintentional.

## 2019-08-10 NOTE — ED Notes (Signed)
Pt able to use urinal without assistance.  

## 2019-08-10 NOTE — ED Notes (Signed)
Pt O2 sat still maintaining 85-89% on the NRB mask. Dr. Colon Branch, MD made aware. Requested to put pt on high-flow [place]. Respiratory called.

## 2019-08-10 NOTE — Consult Note (Signed)
Remdesivir - Pharmacy Brief Note   O:  ALT: Hepatic function panel pending CXR: Bilateral pulmonary infiltrates SpO2: Hypoxic on room air requiring high-flow [place]   A/P:  SARS-CoV-2 antigen positive 08/25/2019  Remdesivir 200 mg IVPB once followed by 100 mg IVPB daily x 4 days.   Dorothea Ogle Pharmacy Resident 09/03/2019 6:46 PM

## 2019-08-10 NOTE — ED Notes (Signed)
FIRST NURSE NOTE- sent for possible pericarditis.  Has had weakness and chest pain.  CT scan done 12/30.

## 2019-08-11 ENCOUNTER — Inpatient Hospital Stay: Payer: PPO

## 2019-08-11 ENCOUNTER — Inpatient Hospital Stay (HOSPITAL_COMMUNITY)
Admit: 2019-08-11 | Discharge: 2019-08-11 | Disposition: A | Discharge status: Home / Self Care | Payer: PPO | Attending: Family Medicine | Admitting: Family Medicine

## 2019-08-11 DIAGNOSIS — J9601 Acute respiratory failure with hypoxia: Secondary | ICD-10-CM

## 2019-08-11 DIAGNOSIS — U071 COVID-19: Principal | ICD-10-CM

## 2019-08-11 DIAGNOSIS — H409 Unspecified glaucoma: Secondary | ICD-10-CM

## 2019-08-11 DIAGNOSIS — N400 Enlarged prostate without lower urinary tract symptoms: Secondary | ICD-10-CM

## 2019-08-11 DIAGNOSIS — I361 Nonrheumatic tricuspid (valve) insufficiency: Secondary | ICD-10-CM

## 2019-08-11 DIAGNOSIS — J1282 Pneumonia due to coronavirus disease 2019: Secondary | ICD-10-CM

## 2019-08-11 DIAGNOSIS — K219 Gastro-esophageal reflux disease without esophagitis: Secondary | ICD-10-CM

## 2019-08-11 LAB — D-DIMER, QUANTITATIVE: D-Dimer, Quant: 0.95 ug/mL-FEU — ABNORMAL HIGH (ref 0.00–0.50)

## 2019-08-11 LAB — TROPONIN I (HIGH SENSITIVITY): Troponin I (High Sensitivity): 5 ng/L (ref <18)

## 2019-08-11 LAB — CBC WITH DIFFERENTIAL/PLATELET
Abs Immature Granulocytes: 0.02 10*3/uL (ref 0.00–0.07)
Basophils Absolute: 0 10*3/uL (ref 0.0–0.1)
Basophils Relative: 0 %
Eosinophils Absolute: 0 10*3/uL (ref 0.0–0.5)
Eosinophils Relative: 0 %
HCT: 40 % (ref 39.0–52.0)
Hemoglobin: 13.4 g/dL (ref 13.0–17.0)
Immature Granulocytes: 1 %
Lymphocytes Relative: 17 %
Lymphs Abs: 0.5 10*3/uL — ABNORMAL LOW (ref 0.7–4.0)
MCH: 29.8 pg (ref 26.0–34.0)
MCHC: 33.5 g/dL (ref 30.0–36.0)
MCV: 89.1 fL (ref 80.0–100.0)
Monocytes Absolute: 0.2 10*3/uL (ref 0.1–1.0)
Monocytes Relative: 6 %
Neutro Abs: 2.4 10*3/uL (ref 1.7–7.7)
Neutrophils Relative %: 76 %
Platelets: 200 10*3/uL (ref 150–400)
RBC: 4.49 MIL/uL (ref 4.22–5.81)
RDW: 12.6 % (ref 11.5–15.5)
WBC: 3.2 10*3/uL — ABNORMAL LOW (ref 4.0–10.5)
nRBC: 0 % (ref 0.0–0.2)

## 2019-08-11 LAB — COMPREHENSIVE METABOLIC PANEL
ALT: 31 U/L (ref 0–44)
AST: 43 U/L — ABNORMAL HIGH (ref 15–41)
Albumin: 2.5 g/dL — ABNORMAL LOW (ref 3.5–5.0)
Alkaline Phosphatase: 67 U/L (ref 38–126)
Anion gap: 10 (ref 5–15)
BUN: 18 mg/dL (ref 8–23)
CO2: 23 mmol/L (ref 22–32)
Calcium: 8 mg/dL — ABNORMAL LOW (ref 8.9–10.3)
Chloride: 103 mmol/L (ref 98–111)
Creatinine, Ser: 0.63 mg/dL (ref 0.61–1.24)
GFR calc Af Amer: >60 mL/min (ref >60)
GFR calc non Af Amer: >60 mL/min (ref >60)
Glucose, Bld: 172 mg/dL — ABNORMAL HIGH (ref 70–99)
Potassium: 4.1 mmol/L (ref 3.5–5.1)
Sodium: 136 mmol/L (ref 135–145)
Total Bilirubin: 0.7 mg/dL (ref 0.3–1.2)
Total Protein: 5.8 g/dL — ABNORMAL LOW (ref 6.5–8.1)

## 2019-08-11 LAB — C-REACTIVE PROTEIN: CRP: 9.9 mg/dL — ABNORMAL HIGH (ref <1.0)

## 2019-08-11 LAB — GLUCOSE, CAPILLARY: Glucose-Capillary: 140 mg/dL — ABNORMAL HIGH (ref 70–99)

## 2019-08-11 LAB — URINALYSIS, COMPLETE (UACMP) WITH MICROSCOPIC
Bacteria, UA: NONE SEEN
Bilirubin Urine: NEGATIVE
Glucose, UA: NEGATIVE mg/dL
Hgb urine dipstick: NEGATIVE
Ketones, ur: NEGATIVE mg/dL
Leukocytes,Ua: NEGATIVE
Nitrite: NEGATIVE
Protein, ur: NEGATIVE mg/dL
Specific Gravity, Urine: 1.012 (ref 1.005–1.030)
Squamous Epithelial / HPF: NONE SEEN (ref 0–5)
pH: 6 (ref 5.0–8.0)

## 2019-08-11 LAB — ECHOCARDIOGRAM COMPLETE
Height: 66 in
Weight: 2560 oz

## 2019-08-11 LAB — MRSA PCR SCREENING: MRSA by PCR: NEGATIVE

## 2019-08-11 LAB — FERRITIN: Ferritin: 1709 ng/mL — ABNORMAL HIGH (ref 24–336)

## 2019-08-11 MED ORDER — TOCILIZUMAB 400 MG/20ML IV SOLN
8.0000 mg/kg | Freq: Once | INTRAVENOUS | Status: DC
  Filled 2019-08-11: qty 29

## 2019-08-11 MED ORDER — INSULIN ASPART 100 UNIT/ML ~~LOC~~ SOLN
0.0000 [IU] | SUBCUTANEOUS | Status: DC
  Administered 2019-08-11 – 2019-08-12 (×2): 2 [IU] via SUBCUTANEOUS
  Administered 2019-08-12 – 2019-08-13 (×3): 3 [IU] via SUBCUTANEOUS
  Administered 2019-08-14: 2 [IU] via SUBCUTANEOUS
  Administered 2019-08-14: 3 [IU] via SUBCUTANEOUS
  Administered 2019-08-15: 2 [IU] via SUBCUTANEOUS
  Filled 2019-08-11 (×13): qty 1

## 2019-08-11 MED ORDER — SODIUM CHLORIDE 0.9 % IV SOLN
2.0000 g | INTRAVENOUS | Status: DC
  Administered 2019-08-11 – 2019-08-12 (×2): 2 g via INTRAVENOUS
  Filled 2019-08-11: qty 2
  Filled 2019-08-11: qty 20

## 2019-08-11 MED ORDER — THIAMINE HCL 100 MG/ML IJ SOLN
200.0000 mg | Freq: Two times a day (BID) | INTRAVENOUS | Status: AC
  Administered 2019-08-11 – 2019-08-13 (×6): 200 mg via INTRAVENOUS
  Filled 2019-08-11 (×6): qty 2

## 2019-08-11 MED ORDER — IVERMECTIN 3 MG PO TABS
200.0000 ug/kg | ORAL_TABLET | ORAL | Status: AC
  Administered 2019-08-11 – 2019-08-13 (×2): 15000 ug via ORAL
  Filled 2019-08-11 (×3): qty 5

## 2019-08-11 MED ORDER — VITAMIN D3 25 MCG (1000 UNIT) PO TABS
2000.0000 [IU] | ORAL_TABLET | Freq: Every day | ORAL | Status: DC
  Administered 2019-08-11 – 2019-08-15 (×5): 2000 [IU] via ORAL
  Filled 2019-08-11 (×8): qty 2

## 2019-08-11 MED ORDER — FAMOTIDINE 20 MG PO TABS
40.0000 mg | ORAL_TABLET | Freq: Every day | ORAL | Status: DC
  Administered 2019-08-12: 40 mg via ORAL
  Administered 2019-08-13: 20 mg via ORAL
  Administered 2019-08-14 – 2019-08-15 (×2): 40 mg via ORAL
  Filled 2019-08-11 (×5): qty 2

## 2019-08-11 MED ORDER — ASCORBIC ACID 500 MG PO TABS
500.0000 mg | ORAL_TABLET | Freq: Two times a day (BID) | ORAL | Status: DC
  Administered 2019-08-13 – 2019-08-15 (×5): 500 mg via ORAL
  Filled 2019-08-11 (×5): qty 1

## 2019-08-11 MED ORDER — AZITHROMYCIN 500 MG PO TABS
500.0000 mg | ORAL_TABLET | Freq: Every day | ORAL | Status: AC
  Administered 2019-08-11: 500 mg via ORAL
  Filled 2019-08-11: qty 1

## 2019-08-11 MED ORDER — AZITHROMYCIN 250 MG PO TABS: 250.0000 mg | ORAL_TABLET | Freq: Every day | ORAL | Status: DC

## 2019-08-11 MED ORDER — CHLORHEXIDINE GLUCONATE CLOTH 2 % EX PADS
6.0000 | MEDICATED_PAD | Freq: Every day | CUTANEOUS | Status: DC
  Administered 2019-08-13 – 2019-08-14 (×2): 6 via TOPICAL

## 2019-08-11 MED ORDER — SODIUM CHLORIDE 0.9 % IV SOLN
100.0000 mg | Freq: Two times a day (BID) | INTRAVENOUS | Status: DC
  Administered 2019-08-12 – 2019-08-15 (×7): 100 mg via INTRAVENOUS
  Filled 2019-08-11 (×10): qty 100

## 2019-08-11 NOTE — Consult Note (Signed)
Name: Bill Cooper MRN: [id number] DOB: May 12, 1930    ADMISSION DATE:  08/16/2019 CONSULTATION DATE:  08/11/2019  REFERRING MD : Dr. Leslye Peer  CHIEF COMPLAINT: Shortness of breath, hypoxia  BRIEF PATIENT DESCRIPTION:  86 year old male admitted 08/24/2019 with acute hypoxic respiratory failure in the setting of COVID-19 pneumonia.  Given his increasing FiO2 requirements on 1/6, he was admitted to stepdown unit with PCCM consultation.  SIGNIFICANT EVENTS  1/5-presented to [facility] ED 1/6-admission status changed to stepdown due to increasing FiO2 requirements, PCCM consulted  STUDIES:  12/30 CTA chest>>1. No CT evidence of pulmonary embolism. 2. Small bilateral pleural effusions, new since the prior CT. 3. Minimal apparent thickening of the pericardium with induration of the pericardial fat. Clinical correlation is recommended to evaluate for pericarditis. 4. Aortic Atherosclerosis  1/5- chest x-ray>>Bilateral pulmonary infiltrates, left side greater than right. Findings most consistent with pneumonia. 1/6-bilateral carotid ultrasound>>Very minimal amount of bilateral atherosclerotic plaque and intimal wall thickening, right greater than left, not resulting in a hemodynamically significant stenosis within either internal carotid Artery. 1/6- 2D Echocardiogram>> 1. Left ventricular ejection fraction, by visual estimation, is 50 to 55%. The left ventricle has normal function. There is no left ventricular hypertrophy. Unable to exclude regional wall motion abnormality.  2. Left ventricular diastolic parameters are consistent with Grade I diastolic dysfunction (impaired relaxation).  3. Global right ventricle has normal systolic function.The right ventricular size is normal. No increase in right ventricular wall thickness.  4. Left atrial size was normal.  5. Normal pulmonary artery systolic pressure.  CULTURES: SARS-CoV-2 antigen 1/5>> POSITIVE Blood culture x2 1/5>> Urine 1/6>> MRSA PCR  1/6>> negative Strep pneumo urinary antigen 1/6>> Legionella urinary antigens 1/6>>  ANTIBIOTICS: Azithromycin 1/5>>1/5 Ceftriaxone 1/6>> Doxycycline 1/6>> Remdesivir 1/5>>   HISTORY OF PRESENT ILLNESS:   Bill Cooper is a 86 year old male with a past medical history notable for GERD, BPH, glaucoma, postsurgical DVT who presented to [facility] ED on 09/04/2019 with complaints of acute onset of generalized weakness and fatigue, and progressive shortness of breath , dry cough, and intermittent left lower parasternal chest pain. He reports 2 episodes of syncope over the last week and a half.  He reported dry cough and intermittent chest pain for the last couple months.  He had a CTA chest previously on 08/04/2019 which was negative for PE, but concerning for possible pericarditis for which he was placed on p.o. prednisone.  He denied loss of taste or smell, fever or chills, nausea or vomiting.  Upon presentation to the ED he was noted to be tachypneic and hypoxic with O2 sats 90% on room air that decreased to 83%.  He was subsequently placed on high flow nasal cannula at 15 L/min with noted improvement in his O2 sats.  His COVID-19 antigen was positive.  Chest x-ray was concerning for bilateral infiltrates consistent with pneumonia.  He was given 8 mg of IV Decadron and placed on IV remdesivir.  He was admitted to the stepdown unit by the hospitalist for further work-up and treatment of acute hypoxic respiratory failure in the setting of COVID-19 pneumonia.  PCCM is consulted for further management.  PAST MEDICAL HISTORY :   has a past medical history of DVT (deep venous thrombosis) ([place]) (2012), GERD (gastroesophageal reflux disease), Wears dentures, and Wears hearing aid.  has a past surgical history that includes Total hip arthroplasty (Bilateral); Knee arthroscopy; Appendectomy; Cholecystectomy; Colonoscopy (2006); Cataract extraction w/PHACO (Right, 12/28/2014); and Upper gi endoscopy (2006). Prior to  Admission medications  Medication Sig Start Date End Date Taking? Authorizing Provider  latanoprost (XALATAN) 0.005 % ophthalmic solution INT 1 GTT IN OU QHS 02/19/18  Yes [provider]  Multiple Vitamin (MULTIVITAMIN) capsule Take 1 capsule by mouth daily.   Yes [provider]  pantoprazole (PROTONIX) 40 MG tablet Take 1 tablet (40 mg total) by mouth daily. 05/24/19 05/23/20 Yes Auburn Bilberry, MD  predniSONE (DELTASONE) 10 MG tablet Take 10-40 mg by mouth daily. 8 day taper 08/02/19  Yes [provider]  tamsulosin (FLOMAX) 0.4 MG CAPS capsule Take 1 capsule (0.4 mg total) by mouth daily after supper. 05/24/19  Yes Auburn Bilberry, MD  timolol (TIMOPTIC) 0.5 % ophthalmic solution INT 1 GTT IN OU QD 05/11/18  Yes [provider]   No Known Allergies  FAMILY HISTORY:  family history includes CAD in his father; Colon cancer in his brother; Liver cancer in his brother; Lung cancer in his brother; Prostate cancer in his brother. SOCIAL HISTORY:  reports that he has never smoked. He has never used smokeless tobacco. He reports that he does not drink alcohol or use drugs.   COVID-19 DISASTER DECLARATION:  FULL CONTACT PHYSICAL EXAMINATION WAS NOT POSSIBLE DUE TO TREATMENT OF COVID-19 AND  CONSERVATION OF PERSONAL PROTECTIVE EQUIPMENT, LIMITED EXAM FINDINGS INCLUDE-  Patient assessed or the symptoms described in the history of present illness.  In the context of the Global COVID-19 pandemic, which necessitated consideration that the patient might be at risk for infection with the SARS-CoV-2 virus that causes COVID-19, Institutional protocols and algorithms that pertain to the evaluation of patients at risk for COVID-19 are in a state of rapid change based on information released by regulatory bodies including the CDC and federal and state organizations. These policies and algorithms were followed during the patient's care while in hospital.  REVIEW OF  SYSTEMS: Positives in BOLD Constitutional: Negative for fever, chills, weight loss, malaise/fatigue and diaphoresis.  HENT: Negative for hearing loss, ear pain, nosebleeds, congestion, sore throat, neck pain, tinnitus and ear discharge.   Eyes: Negative for blurred vision, double vision, photophobia, pain, discharge and redness.  Respiratory: Negative for +cough, hemoptysis, +sputum production, +shortness of breath, wheezing and stridor.   Cardiovascular: Negative for +Pleuritic chest pain, palpitations, orthopnea, claudication, leg swelling and PND.  Gastrointestinal: Negative for heartburn, nausea, vomiting, abdominal pain, diarrhea, constipation, blood in stool and melena.  Genitourinary: Negative for dysuria, urgency, frequency, hematuria and flank pain.  Musculoskeletal: Negative for myalgias, back pain, joint pain and falls.  Skin: Negative for itching and rash.  Neurological: Negative for dizziness, tingling, tremors, sensory change, speech change, focal weakness, seizures, loss of consciousness, weakness and headaches.  Endo/Heme/Allergies: Negative for environmental allergies and polydipsia. Does not bruise/bleed easily.  SUBJECTIVE:  Pt reports that his SOB has improved at rest Does continue to report dyspnea on exertion, cough with intermittent green sputum, pleuritic chest pain Denies fever/chills, N/V, abdominal pain, diarrhea, HA, palpitations, edema On 100% HFNC + 100% NRB Appears comfortable  VITAL SIGNS: Temp:  [96.9 F (36.1 C)] 96.9 F (36.1 C) (01/06 1600) Pulse Rate:  [71-92] 83 (01/06 1900) Resp:  [19-35] 31 (01/06 1900) BP: (94-149)/(53-133) 116/81 (01/06 1900) SpO2:  [84 %-100 %] 100 % (01/06 1900) FiO2 (%):  [76.6 %-100 %] 100 % (01/06 1612)  PHYSICAL EXAMINATION: General: Acutely ill-appearing male, sitting in bed, on HFNC plus NRB, in no acute distress Neuro: Awake, alert and oriented x3, follows commands, no focal deficits, speech clear, pupils  PERRLA HEENT: Atraumatic,  normocephalic, neck supple, no JVD Cardiovascular: Regular rate and rhythm, 2+ pulses Lungs: Unable to auscultate due to CAPR, even, nonlabored, no accessory muscle use Abdomen: Soft, nontender, nondistended, no guarding or rebound tenderness Musculoskeletal: No deformities, normal bulk and tone, no edema Skin: Warm and dry, no obvious rashes, lesions, or ulcerations  Recent Labs  Lab 08/25/2019 1424 08/11/19 0517  NA 132* 136  K 4.8 4.1  CL 95* 103  CO2 27 23  BUN 16 18  CREATININE 0.68 0.63  GLUCOSE 140* 172*   Recent Labs  Lab 08/26/2019 1424 08/11/19 0517  HGB 14.9 13.4  HCT 45.3 40.0  WBC 4.9 3.2*  PLT 248 200   DG Chest 2 View  Result Date: 08/26/2019 CLINICAL DATA:  Syncope. EXAM: CHEST - 2 VIEW COMPARISON:  CT 08/04/2019.  Chest x-ray 05/22/2019. FINDINGS: Mediastinum is normal. Heart size normal. Bilateral pulmonary infiltrates, left side greater than right. No pleural effusion or pneumothorax. Degenerative changes scoliosis thoracic spine. IMPRESSION: Bilateral pulmonary infiltrates, left side greater than right. Findings most consistent with pneumonia. Electronically Signed   By: Maisie Fus  Register   On: 08/07/2019 15:29   US Carotid Bilateral  Result Date: 08/11/2019 CLINICAL DATA:  Syncopal episode. EXAM: BILATERAL CAROTID DUPLEX ULTRASOUND TECHNIQUE: Wallace Cullens scale imaging, color Doppler and duplex ultrasound were performed of bilateral carotid and vertebral arteries in the neck. COMPARISON:  None. FINDINGS: Criteria: Quantification of carotid stenosis is based on velocity parameters that correlate the residual internal carotid diameter with NASCET-based stenosis levels, using the diameter of the distal internal carotid lumen as the denominator for stenosis measurement. The following velocity measurements were obtained: RIGHT ICA: 87/28 cm/sec CCA: 73/10 cm/sec SYSTOLIC ICA/CCA RATIO:  1.2 ECA: 74 cm/sec LEFT ICA: 71/22 cm/sec CCA: 72/8 cm/sec SYSTOLIC  ICA/CCA RATIO:  1.2 ECA: 71 cm/sec RIGHT CAROTID ARTERY: There is a minimal amount of eccentric echogenic plaque involving the proximal aspect the right common carotid artery (image 2). There is a minimal amount of intimal thickening involving the carotid bulb, extending to involve the origin and proximal aspects of the right internal carotid artery (image 24), not resulting in elevated peak systolic velocities within the interrogated course the right internal carotid artery to suggest a hemodynamically significant stenosis. RIGHT VERTEBRAL ARTERY:  Antegrade flow LEFT CAROTID ARTERY: There is a minimal amount of intimal thickening within the left carotid bulb, extending to involve the origin and proximal aspects of the left internal carotid artery (image 57), not resulting in elevated peak systolic velocities within the interrogated portions of the left internal carotid artery to suggest a hemodynamically significant stenosis. LEFT VERTEBRAL ARTERY:  Antegrade flow IMPRESSION: Very minimal amount of bilateral atherosclerotic plaque and intimal wall thickening, right greater than left, not resulting in a hemodynamically significant stenosis within either internal carotid artery. Electronically Signed   By: Simonne Come M.D.   On: 08/11/2019 12:06   ECHOCARDIOGRAM COMPLETE  Result Date: 08/11/2019   ECHOCARDIOGRAM REPORT   Patient Name:   Bill Cooper Date of Exam: 08/11/2019 Medical Rec #:  [id number]       Height:       66.0 in Accession #:    [id number]      Weight:       160.0 lb Date of Birth:  12/28/1929       BSA:          1.82 m Patient Age:    89 years        BP:  94/76 mmHg Patient Gender: M               HR:           80 bpm. Exam Location:  [facility] Procedure: 2D Echo, Cardiac Doppler and Color Doppler Indications:     Syncope 780.2  History:         Patient has prior history of Echocardiogram examinations, most                  recent 05/23/2019. DVT, pt tested positive for covid 08/30/2019.   Sonographer:     Cristela BlueJerry Hege RDCS (AE) Referring Phys:  [id number] Vernetta HoneyJAN A MANSY Diagnosing Phys: Julien Nordmannimothy Gollan MD  Sonographer Comments: No parasternal window, no subcostal window and suboptimal apical window. IMPRESSIONS  1. Challenging images  2. Left ventricular ejection fraction, by visual estimation, is 50 to 55%. The left ventricle has normal function. There is no left ventricular hypertrophy. Unable to exclude regional wall motion abnormality.  3. Left ventricular diastolic parameters are consistent with Grade I diastolic dysfunction (impaired relaxation).  4. Global right ventricle has normal systolic function.The right ventricular size is normal. No increase in right ventricular wall thickness.  5. Left atrial size was normal.  6. Normal pulmonary artery systolic pressure. FINDINGS  Left Ventricle: Left ventricular ejection fraction, by visual estimation, is 50 to 55%. The left ventricle has normal function. The left ventricle demonstrates regional wall motion abnormalities. There is no left ventricular hypertrophy. Left ventricular diastolic parameters are consistent with Grade I diastolic dysfunction (impaired relaxation). Normal left atrial pressure. Right Ventricle: The right ventricular size is normal. No increase in right ventricular wall thickness. Global RV systolic function is has normal systolic function. The tricuspid regurgitant velocity is 2.09 m/s, and with an assumed right atrial pressure  of 10 mmHg, the estimated right ventricular systolic pressure is normal at 27.5 mmHg. Left Atrium: Left atrial size was normal in size. Right Atrium: Right atrial size was normal in size Pericardium: There is no evidence of pericardial effusion. Mitral Valve: The mitral valve is normal in structure. No evidence of mitral valve regurgitation. No evidence of mitral valve stenosis by observation. Tricuspid Valve: The tricuspid valve is normal in structure. Tricuspid valve regurgitation is mild. Aortic Valve: The  aortic valve is normal in structure. Aortic valve regurgitation is not visualized. The aortic valve is structurally normal, with no evidence of sclerosis or stenosis. Aortic valve mean gradient measures 1.0 mmHg. Aortic valve peak gradient measures 2.8 mmHg. Aortic valve area, by VTI measures 3.66 cm. Pulmonic Valve: The pulmonic valve was normal in structure. Pulmonic valve regurgitation is not visualized. Pulmonic regurgitation is not visualized. Aorta: The aortic root, ascending aorta and aortic arch are all structurally normal, with no evidence of dilitation or obstruction. Venous: The inferior vena cava is normal in size with greater than 50% respiratory variability, suggesting right atrial pressure of 3 mmHg. IAS/Shunts: No atrial level shunt detected by color flow Doppler. There is no evidence of a patent foramen ovale. No ventricular septal defect is seen or detected. There is no evidence of an atrial septal defect.  LEFT VENTRICLE PLAX 2D LVIDd:         3.73 cm  Diastology LVIDs:         2.71 cm  LV e' lateral:   5.85 cm/s LV PW:         1.35 cm  LV E/e' lateral: 7.5 LV IVS:        1.15 cm  LV e' medial:    1.31 cm/s LVOT diam:     2.00 cm  LV E/e' medial:  33.4 LV SV:         32 ml LV SV Index:   17.29 LVOT Area:     3.14 cm  RIGHT VENTRICLE RV Basal diam:  2.84 cm LEFT ATRIUM         Index LA diam:    4.00 cm 2.20 cm/m  AORTIC VALVE AV Area (Vmax):    2.65 cm AV Area (Vmean):   2.97 cm AV Area (VTI):     3.66 cm AV Vmax:           83.95 cm/s AV Vmean:          50.600 cm/s AV VTI:            0.124 m AV Peak Grad:      2.8 mmHg AV Mean Grad:      1.0 mmHg LVOT Vmax:         70.90 cm/s LVOT Vmean:        47.800 cm/s LVOT VTI:          0.144 m LVOT/AV VTI ratio: 1.17  AORTA Ao Root diam: 3.30 cm MITRAL VALVE                        TRICUSPID VALVE MV Area (PHT): 6.17 cm             TR Peak grad:   17.5 mmHg MV PHT:        35.67 msec           TR Vmax:        209.00 cm/s MV Decel Time: 123 msec MV E  velocity: 43.70 cm/s 103 cm/s  SHUNTS MV A velocity: 90.70 cm/s 70.3 cm/s Systemic VTI:  0.14 m MV E/A ratio:  0.48       1.5       Systemic Diam: 2.00 cm  Julien Nordmann MD Electronically signed by Julien Nordmann MD Signature Date/Time: 08/11/2019/5:31:16 PM    Final     ASSESSMENT / PLAN:  Acute hypoxic respiratory failure secondary to COVID-19 pneumonia -Supplemental O2 as needed to maintain O2 sats greater than 80% -Allow for permissive hypoxia as long as patient is asymptomatic -Follow intermittent chest x-ray and ABG as needed -CTA chest 12/30  Negative for PE -Follow inflammatory markers ~currently downtrending -Continue Rocephin, doxycycline and ivermectin added by Dr. Jayme Cloud today 1/6 -Continue IV remdesivir -Continue IV steroids -As needed bronchodilators (MDI) -Vitamin C and zinc -Antitussives -Encourage self proning as tolerated for up to 16 hours/day -Incentive spirometry and flutter valve -High risk for intubation  Multifocal pneumonia, ?  Superimposed bacterial pneumonia -Monitor fever curve -Trend WBCs and procalcitonin -Follow cultures as above -Continue Rocephin, placed on doxycycline today by Dr. Jayme Cloud  Syncope Chest pain, likely pleuritic -Continuous cardiac monitoring -Maintain MAP greater than 65 -Gentle IV fluids -Carotid ultrasound with no significant stenosis -2D echocardiogram pending -Follow high sensitivity troponin (6 ~ 5 ~ 5) -As needed sublingual nitroglycerin and morphine  Hyperglycemia -CBGs -Sliding scale insulin as needed -Follow ICU hypo-/hyperglycemia protocol  GERD -Continue Pepcid  BPH -Continue Flomax           Disposition: Stepdown Goals of care: Full code VTE prophylaxis: Subcu Lovenox Updates: Updated patient at bedside 08/11/2019  Harlon Ditty, [facility] Pager: [phone]  08/11/2019, 9:14 PM

## 2019-08-11 NOTE — Progress Notes (Signed)
*  PRELIMINARY RESULTS* Echocardiogram 2D Echocardiogram has been performed.  Cristela Blue 08/11/2019, 2:28 PM

## 2019-08-11 NOTE — Progress Notes (Signed)
Pt arrived on unit at this time. VSS. Pt on 100% nonrebreather. Alert and oriented but HOH.

## 2019-08-11 NOTE — Progress Notes (Signed)
Patient ID: Bill Cooper, male   DOB: Jul 31, 1930, 86 y.o.   MRN: [id number] [facility] PROGRESS NOTE  Bill Cooper BZJ:[id number] DOB: 12-02-1929 DOA: 2019/09/03 PCP: Lynnea Ferrier, MD  HPI/Subjective: Patient's major complaint is abdominal pain.  Mostly in the left lower quadrant but also in lower abdomen.  No nausea vomiting.  No diarrhea or constipation.  Does have some shortness of breath and a little cough.  Diagnosed with Covid 19 last night and started on remdesivir.  Objective: Vitals:   08/11/19 0800 08/11/19 0815  BP: 121/71   Pulse: 76 79  Resp: (!) 27 (!) 25  Temp:    SpO2: 91% 91%    Intake/Output Summary (Last 24 hours) at 08/11/2019 0823 Last data filed at 2019-09-03 2254 Gross per 24 hour  Intake 250 ml  Output --  Net 250 ml   Filed Weights   09/03/19 1415  Weight: 72.6 kg    ROS: Review of Systems  Constitutional: Negative for chills and fever.  Eyes: Negative for blurred vision.  Respiratory: Positive for cough and shortness of breath.   Cardiovascular: Negative for chest pain.  Gastrointestinal: Positive for abdominal pain. Negative for constipation, diarrhea, nausea and vomiting.  Genitourinary: Negative for dysuria.  Musculoskeletal: Negative for joint pain.  Neurological: Negative for dizziness and headaches.   Exam: Physical Exam  Constitutional: He is oriented to person, place, and time.  HENT:  Nose: No mucosal edema.  Mouth/Throat: No oropharyngeal exudate or posterior oropharyngeal edema.  Eyes: Pupils are equal, round, and reactive to light. Conjunctivae, EOM and lids are normal.  Neck: No JVD present. Carotid bruit is not present. No thyroid mass and no thyromegaly present.  Cardiovascular: S1 normal and S2 normal. Exam reveals no gallop.  No murmur heard. Pulses:      Dorsalis pedis pulses are 2+ on the right side and 2+ on the left side.  Respiratory: No respiratory distress. He has no wheezes. He has no rhonchi. He has no  rales.  GI: Soft. Bowel sounds are normal. There is abdominal tenderness in the suprapubic area and left lower quadrant.  Musculoskeletal:     Cervical back: No edema.     Right ankle: No swelling.     Left ankle: No swelling.  Lymphadenopathy:    He has no cervical adenopathy.  Neurological: He is alert and oriented to person, place, and time. No cranial nerve deficit.  Skin: Skin is warm. No rash noted. Nails show no clubbing.  Psychiatric: He has a normal mood and affect.      Data Reviewed: Basic Metabolic Panel: Recent Labs  Lab 08/04/19 1015 03-Sep-2019 1424 08/11/19 0517  NA  --  132* 136  K  --  4.8 4.1  CL  --  95* 103  CO2  --  27 23  GLUCOSE  --  140* 172*  BUN  --  16 18  CREATININE 0.60* 0.68 0.63  CALCIUM  --  8.4* 8.0*   Liver Function Tests: Recent Labs  Lab 09-03-19 1424 09/03/19 1655 08/11/19 0517  AST  --  55* 43*  ALT 35 34 31  ALKPHOS  --  75 67  BILITOT  --  0.8 0.7  PROT  --  6.8 5.8*  ALBUMIN  --  3.0* 2.5*   Recent Labs  Lab 09-03-19 1424  LIPASE 24   CBC: Recent Labs  Lab 09-03-19 1424 08/11/19 0517  WBC 4.9 3.2*  NEUTROABS  --  2.4  HGB 14.9 13.4  HCT 45.3 40.0  MCV 90.2 89.1  PLT 248 200     Recent Results (from the past 240 hour(s))  Culture, blood (Routine X 2) w Reflex to ID Panel     Status: None (Preliminary result)   Collection Time: 08/09/2019  9:58 PM   Specimen: BLOOD  Result Value Ref Range Status   Specimen Description BLOOD LEFT FOREARM  Final   Special Requests   Final    BOTTLES DRAWN AEROBIC AND ANAEROBIC Blood Culture adequate volume   Culture   Final    NO GROWTH < 12 HOURS Performed at [facility], [address], [place], [place] [address]    Report Status PENDING  Incomplete  Culture, blood (Routine X 2) w Reflex to ID Panel     Status: None (Preliminary result)   Collection Time: 08/21/2019  9:58 PM   Specimen: BLOOD  Result Value Ref Range Status   Specimen Description BLOOD LEFT  ASSIST CONTROL  Final   Special Requests   Final    BOTTLES DRAWN AEROBIC AND ANAEROBIC Blood Culture results may not be optimal due to an excessive volume of blood received in culture bottles   Culture   Final    NO GROWTH < 12 HOURS Performed at [facility], [address], [place], [place] [address]    Report Status PENDING  Incomplete     Studies: DG Chest 2 View  Result Date: 08/16/2019 CLINICAL DATA:  Syncope. EXAM: CHEST - 2 VIEW COMPARISON:  CT 08/04/2019.  Chest x-ray 05/22/2019. FINDINGS: Mediastinum is normal. Heart size normal. Bilateral pulmonary infiltrates, left side greater than right. No pleural effusion or pneumothorax. Degenerative changes scoliosis thoracic spine. IMPRESSION: Bilateral pulmonary infiltrates, left side greater than right. Findings most consistent with pneumonia. Electronically Signed   By: Maisie Fus  Register   On: 08/31/2019 15:29    Scheduled Meds: . vitamin C  500 mg Oral Daily  . aspirin EC  81 mg Oral Daily  . azithromycin  500 mg Oral Daily   Followed by  . [START ON 08/12/2019] azithromycin  250 mg Oral Daily  . cholecalciferol  1,000 Units Oral Daily  . dexamethasone (DECADRON) injection  6 mg Intravenous q1800  . enoxaparin (LOVENOX) injection  40 mg Subcutaneous Q24H  . famotidine  20 mg Oral BID  . guaiFENesin  600 mg Oral BID  . latanoprost  1 drop Both Eyes QHS  . multivitamin with minerals  1 tablet Oral Daily  . [START ON 08/12/2019] pantoprazole  40 mg Oral QAC breakfast  . tamsulosin  0.4 mg Oral QPC supper  . timolol  1 drop Both Eyes Daily  . zinc sulfate  220 mg Oral Daily   Continuous Infusions: . sodium chloride 100 mL/hr at 09/05/2019 2156  . cefTRIAXone (ROCEPHIN)  IV    . remdesivir 100 mg in NS 100 mL    . tocilizumab (ACTEMRA) - non-COVID treatment      Assessment/Plan:  1. Acute hypoxic respiratory failure secondary to COVID-19 pneumonia.  Since the patient is on 100% high flow nasal cannula +100%  nonrebreather mask I will change patient status to stepdown unit and consult critical care specialist.  Patient high risk for cardiopulmonary arrest and intubation.  Case discussed on the phone with critical care specialist Dr. Jayme Cloud.  She would like me to hold the Actemra for now.  Continue the remdesivir and steroids.  Will give empiric Rocephin and Zithromax.  Patient started on  vitamin C and zinc.  CODE STATUS discussed and patient wishes to be a full code at this point stating that you can try CPR one time.  Patient too sick at this point to transfer to [facility]. 2. Abdominal pain left lower quadrant and suprapubic.  Send off a urine analysis and urine culture.  Get a bladder scan to see if he is retaining urine.  Rocephin would cover anything abdominal. 3. GERD on Pepcid 4. Glaucoma unspecified on eyedrops 5. BPH on Flomax  Code Status:     Code Status Orders  (From admission, onward)         Start     Ordered   08/18/2019 2054  Full code  Continuous     August 17, 2019 2054        Code Status History    Date Active Date Inactive Code Status Order ID Comments User Context   17-Aug-2019 1919 08/16/2019 2054 DNR [id number]  Christel Mormon, MD ED   05/22/2019 1413 05/24/2019 1746 DNR [id number]  Dustin Flock, MD Inpatient   05/22/2019 1338 05/22/2019 1413 Full Code [id number]  Dustin Flock, MD Inpatient   05/06/2016 1525 05/08/2016 1226 Full Code [id number]  Loletha Grayer, MD ED   Advance Care Planning Activity     Family Communication: Spoke with the patient's wife on the phone Disposition Plan: To be determined.  Patient too sick to be transferred to [facility] at this point  Consultants:  Critical care specialist  Antibiotics:  Rocephin  Zithromax  Time spent: 35 minutes  [place]

## 2019-08-11 NOTE — ED Notes (Signed)
Respritory to bedside due to pt mouth breathing and sat lowering. Per RT, COVID pts tend to lower the sats while sleeping and pt was placed on added non-rebreather with the 15L high flow @ 100%. Pts sats at 89-92%.

## 2019-08-11 NOTE — Progress Notes (Signed)
Report called to [facility], patient currently resting on stretcher, O2 sats 91% on HFNC, does desat in 80's occasionally, patient to be transferred to ICU on non-rebreather.

## 2019-08-12 ENCOUNTER — Inpatient Hospital Stay: Payer: PPO

## 2019-08-12 DIAGNOSIS — R0781 Pleurodynia: Secondary | ICD-10-CM

## 2019-08-12 LAB — CBC WITH DIFFERENTIAL/PLATELET
Abs Immature Granulocytes: 0.05 10*3/uL (ref 0.00–0.07)
Basophils Absolute: 0 10*3/uL (ref 0.0–0.1)
Basophils Relative: 0 %
Eosinophils Absolute: 0 10*3/uL (ref 0.0–0.5)
Eosinophils Relative: 0 %
HCT: 41.7 % (ref 39.0–52.0)
Hemoglobin: 13.7 g/dL (ref 13.0–17.0)
Immature Granulocytes: 1 %
Lymphocytes Relative: 9 %
Lymphs Abs: 0.7 10*3/uL (ref 0.7–4.0)
MCH: 29.4 pg (ref 26.0–34.0)
MCHC: 32.9 g/dL (ref 30.0–36.0)
MCV: 89.5 fL (ref 80.0–100.0)
Monocytes Absolute: 0.5 10*3/uL (ref 0.1–1.0)
Monocytes Relative: 6 %
Neutro Abs: 6.1 10*3/uL (ref 1.7–7.7)
Neutrophils Relative %: 84 %
Platelets: 232 10*3/uL (ref 150–400)
RBC: 4.66 MIL/uL (ref 4.22–5.81)
RDW: 12.7 % (ref 11.5–15.5)
WBC: 7.3 10*3/uL (ref 4.0–10.5)
nRBC: 0 % (ref 0.0–0.2)

## 2019-08-12 LAB — GLUCOSE, CAPILLARY
Glucose-Capillary: 125 mg/dL — ABNORMAL HIGH (ref 70–99)
Glucose-Capillary: 174 mg/dL — ABNORMAL HIGH (ref 70–99)
Glucose-Capillary: 102 mg/dL — ABNORMAL HIGH (ref 70–99)
Glucose-Capillary: 194 mg/dL — ABNORMAL HIGH (ref 70–99)

## 2019-08-12 LAB — COMPREHENSIVE METABOLIC PANEL
ALT: 30 U/L (ref 0–44)
AST: 41 U/L (ref 15–41)
Albumin: 2.3 g/dL — ABNORMAL LOW (ref 3.5–5.0)
Alkaline Phosphatase: 64 U/L (ref 38–126)
Anion gap: 10 (ref 5–15)
BUN: 22 mg/dL (ref 8–23)
CO2: 23 mmol/L (ref 22–32)
Calcium: 8.2 mg/dL — ABNORMAL LOW (ref 8.9–10.3)
Chloride: 104 mmol/L (ref 98–111)
Creatinine, Ser: 0.53 mg/dL — ABNORMAL LOW (ref 0.61–1.24)
GFR calc Af Amer: >60 mL/min (ref >60)
GFR calc non Af Amer: >60 mL/min (ref >60)
Glucose, Bld: 146 mg/dL — ABNORMAL HIGH (ref 70–99)
Potassium: 4.4 mmol/L (ref 3.5–5.1)
Sodium: 137 mmol/L (ref 135–145)
Total Bilirubin: 0.6 mg/dL (ref 0.3–1.2)
Total Protein: 5.7 g/dL — ABNORMAL LOW (ref 6.5–8.1)

## 2019-08-12 LAB — FIBRIN DERIVATIVES D-DIMER (ARMC ONLY): Fibrin derivatives D-dimer (ARMC): 665.36 ng/mL (FEU) — ABNORMAL HIGH (ref 0.00–499.00)

## 2019-08-12 LAB — STREP PNEUMONIAE URINARY ANTIGEN: Strep Pneumo Urinary Antigen: NEGATIVE

## 2019-08-12 LAB — HEMOGLOBIN A1C
Hgb A1c MFr Bld: 6.5 % — ABNORMAL HIGH (ref 4.8–5.6)
Mean Plasma Glucose: 139.85 mg/dL

## 2019-08-12 LAB — TROPONIN I (HIGH SENSITIVITY): Troponin I (High Sensitivity): 6 ng/L (ref <18)

## 2019-08-12 LAB — C-REACTIVE PROTEIN: CRP: 6 mg/dL — ABNORMAL HIGH (ref <1.0)

## 2019-08-12 LAB — PROCALCITONIN: Procalcitonin: <0.1 ng/mL

## 2019-08-12 LAB — FERRITIN: Ferritin: 1843 ng/mL — ABNORMAL HIGH (ref 24–336)

## 2019-08-12 MED ORDER — AMOXICILLIN-POT CLAVULANATE 875-125 MG PO TABS
1.0000 | ORAL_TABLET | Freq: Two times a day (BID) | ORAL | Status: DC
  Administered 2019-08-12 – 2019-08-15 (×6): 1 via ORAL
  Filled 2019-08-12 (×7): qty 1

## 2019-08-12 NOTE — Plan of Care (Signed)

## 2019-08-12 NOTE — Progress Notes (Signed)
Pt alert and oriented x 4. sats 98-100 on HHF o2, pt able to ambulate with this RN and walker to bsc and chair. Appetite improving.

## 2019-08-12 NOTE — Progress Notes (Signed)
Patient ID: Bill Cooper, male   DOB: 1929-09-25, 86 y.o.   MRN: [id number] [facility] PROGRESS NOTE  Bill Cooper IEP:[id number] DOB: [birth year] DOA: 08/11/2019 PCP: Lynnea Ferrier, MD  HPI/Subjective: Patient on 100% high flow nasal cannula when I saw him.  Complains of some left-sided chest discomfort.  He stated he had a fall and it may be because of that.  Still having some shortness of breath and cough.  Feels a little bit better than yesterday.  Objective: Vitals:   08/12/19 0600 08/12/19 0700  BP: 120/69 123/71  Pulse: 67 63  Resp: (!) 30 (!) 31  Temp:    SpO2: 93% 94%    Intake/Output Summary (Last 24 hours) at 08/12/2019 1235 Last data filed at 08/12/2019 0700 Gross per 24 hour  Intake 2348.23 ml  Output 1075 ml  Net 1273.23 ml   Filed Weights   08/24/2019 1415  Weight: 72.6 kg    ROS: Review of Systems  Constitutional: Negative for chills and fever.  Eyes: Negative for blurred vision.  Respiratory: Positive for cough and shortness of breath.   Cardiovascular: Positive for chest pain.  Gastrointestinal: Negative for abdominal pain, constipation, diarrhea, nausea and vomiting.  Genitourinary: Negative for dysuria.  Musculoskeletal: Negative for joint pain.  Neurological: Negative for dizziness and headaches.   Exam: Physical Exam  Constitutional: He is oriented to person, place, and time.  HENT:  Nose: No mucosal edema.  Mouth/Throat: No oropharyngeal exudate or posterior oropharyngeal edema.  Eyes: Pupils are equal, round, and reactive to light. Conjunctivae, EOM and lids are normal.  Neck: Carotid bruit is not present.  Cardiovascular: S1 normal and S2 normal. Exam reveals no gallop.  No murmur heard. Pulses:      Dorsalis pedis pulses are 2+ on the right side and 2+ on the left side.  Respiratory: He has decreased breath sounds in the right middle field, the right lower field, the left middle field and the left lower field. He has no wheezes. He  has no rhonchi. He has no rales.  GI: Soft. Bowel sounds are normal. There is no abdominal tenderness.  Musculoskeletal:     Right ankle: No swelling.     Left ankle: No swelling.  Lymphadenopathy:    He has no cervical adenopathy.  Neurological: He is alert and oriented to person, place, and time. No cranial nerve deficit.  Skin: Skin is warm. No rash noted. Nails show no clubbing.  Psychiatric: He has a normal mood and affect.      Data Reviewed: Basic Metabolic Panel: Recent Labs  Lab 08/15/2019 1424 08/11/19 0517 08/12/19 0338  NA 132* 136 137  K 4.8 4.1 4.4  CL 95* 103 104  CO2 27 23 23   GLUCOSE 140* 172* 146*  BUN 16 18 22   CREATININE 0.68 0.63 0.53*  CALCIUM 8.4* 8.0* 8.2*   Liver Function Tests: Recent Labs  Lab 08/26/2019 1424 08/24/2019 1655 08/11/19 0517 08/12/19 0338  AST  --  55* 43* 41  ALT 35 34 31 30  ALKPHOS  --  75 67 64  BILITOT  --  0.8 0.7 0.6  PROT  --  6.8 5.8* 5.7*  ALBUMIN  --  3.0* 2.5* 2.3*   Recent Labs  Lab 08/12/2019 1424  LIPASE 24   CBC: Recent Labs  Lab 08/06/2019 1424 08/11/19 0517 08/12/19 0338  WBC 4.9 3.2* 7.3  NEUTROABS  --  2.4 6.1  HGB 14.9 13.4 13.7  HCT 45.3 40.0  41.7  MCV 90.2 89.1 89.5  PLT 248 200 232     Recent Results (from the past 240 hour(s))  Culture, blood (Routine X 2) w Reflex to ID Panel     Status: None (Preliminary result)   Collection Time: 12-Sep-2019  9:58 PM   Specimen: BLOOD  Result Value Ref Range Status   Specimen Description BLOOD LEFT FOREARM  Final   Special Requests   Final    BOTTLES DRAWN AEROBIC AND ANAEROBIC Blood Culture adequate volume   Culture   Final    NO GROWTH 2 DAYS Performed at [facility], [address] [place], [place] [address]    Report Status PENDING  Incomplete  Culture, blood (Routine X 2) w Reflex to ID Panel     Status: None (Preliminary result)   Collection Time: 12-Sep-2019  9:58 PM   Specimen: BLOOD  Result Value Ref Range Status   Specimen  Description BLOOD LEFT ASSIST CONTROL  Final   Special Requests   Final    BOTTLES DRAWN AEROBIC AND ANAEROBIC Blood Culture results may not be optimal due to an excessive volume of blood received in culture bottles   Culture   Final    NO GROWTH 2 DAYS Performed at [facility], [address] [place], [place] [address]    Report Status PENDING  Incomplete  Urine Culture     Status: Abnormal (Preliminary result)   Collection Time: 08/11/19  9:33 AM   Specimen: Urine, Clean Catch  Result Value Ref Range Status   Specimen Description   Final    URINE, CLEAN CATCH Performed at [facility], [address] [place], [place] [address]    Special Requests   Final    Normal Performed at [facility], [address] [place], [place] [address]    Culture (A)  Final    >=100,000 COLONIES/mL ENTEROCOCCUS FAECALIS SUSCEPTIBILITIES TO FOLLOW Performed at [facility], [address] [place], [place] [address]    Report Status PENDING  Incomplete  MRSA PCR Screening     Status: None   Collection Time: 08/11/19  3:25 PM   Specimen: Nasal Mucosa; Nasopharyngeal  Result Value Ref Range Status   MRSA by PCR NEGATIVE NEGATIVE Final    Comment:        The GeneXpert MRSA Assay (FDA approved for NASAL specimens only), is one component of a comprehensive MRSA colonization surveillance program. It is not intended to diagnose MRSA infection nor to guide or monitor treatment for MRSA infections. Performed at [facility], [address] [place], [place] [address]      Studies: DG Chest 2 View  Result Date: 08/20/2019 CLINICAL DATA:  Syncope. EXAM: CHEST - 2 VIEW COMPARISON:  CT 08/04/2019.  Chest x-ray 05/22/2019. FINDINGS: Mediastinum is normal. Heart size normal. Bilateral pulmonary infiltrates, left side greater than right. No pleural effusion or pneumothorax. Degenerative changes scoliosis thoracic spine. IMPRESSION: Bilateral pulmonary  infiltrates, left side greater than right. Findings most consistent with pneumonia. Electronically Signed   By: Maisie Fushomas  Register   On: 2020/04/02 15:29   US Carotid Bilateral  Result Date: 08/11/2019 CLINICAL DATA:  Syncopal episode. EXAM: BILATERAL CAROTID DUPLEX ULTRASOUND TECHNIQUE: Wallace CullensGray scale imaging, color Doppler and duplex ultrasound were performed of bilateral carotid and vertebral arteries in the neck. COMPARISON:  None. FINDINGS: Criteria: Quantification of carotid stenosis is based on velocity parameters that correlate the residual internal carotid diameter with NASCET-based stenosis levels, using the  diameter of the distal internal carotid lumen as the denominator for stenosis measurement. The following velocity measurements were obtained: RIGHT ICA: 87/28 cm/sec CCA: 73/10 cm/sec SYSTOLIC ICA/CCA RATIO:  1.2 ECA: 74 cm/sec LEFT ICA: 71/22 cm/sec CCA: 72/8 cm/sec SYSTOLIC ICA/CCA RATIO:  1.2 ECA: 71 cm/sec RIGHT CAROTID ARTERY: There is a minimal amount of eccentric echogenic plaque involving the proximal aspect the right common carotid artery (image 2). There is a minimal amount of intimal thickening involving the carotid bulb, extending to involve the origin and proximal aspects of the right internal carotid artery (image 24), not resulting in elevated peak systolic velocities within the interrogated course the right internal carotid artery to suggest a hemodynamically significant stenosis. RIGHT VERTEBRAL ARTERY:  Antegrade flow LEFT CAROTID ARTERY: There is a minimal amount of intimal thickening within the left carotid bulb, extending to involve the origin and proximal aspects of the left internal carotid artery (image 57), not resulting in elevated peak systolic velocities within the interrogated portions of the left internal carotid artery to suggest a hemodynamically significant stenosis. LEFT VERTEBRAL ARTERY:  Antegrade flow IMPRESSION: Very minimal amount of bilateral atherosclerotic plaque  and intimal wall thickening, right greater than left, not resulting in a hemodynamically significant stenosis within either internal carotid artery. Electronically Signed   By: Simonne ComeJohn  Watts M.D.   On: 08/11/2019 12:06   ECHOCARDIOGRAM COMPLETE  Result Date: 08/11/2019   ECHOCARDIOGRAM REPORT   Patient Name:   Dallie DadBEACHAM H Trochez Date of Exam: 08/11/2019 Medical Rec #:  [id number]       Height:       66.0 in Accession #:    [id number]      Weight:       160.0 lb Date of Birth:  05/16/1930       BSA:          1.82 m Patient Age:    89 years        BP:           94/76 mmHg Patient Gender: M               HR:           80 bpm. Exam Location:  [facility] Procedure: 2D Echo, Cardiac Doppler and Color Doppler Indications:     Syncope 780.2  History:         Patient has prior history of Echocardiogram examinations, most                  recent 05/23/2019. DVT, pt tested positive for covid 08/25/2019.  Sonographer:     Cristela BlueJerry Hege RDCS (AE) Referring Phys:  [id number] Vernetta HoneyJAN A MANSY Diagnosing Phys: Julien Nordmannimothy Gollan MD  Sonographer Comments: No parasternal window, no subcostal window and suboptimal apical window. IMPRESSIONS  1. Challenging images  2. Left ventricular ejection fraction, by visual estimation, is 50 to 55%. The left ventricle has normal function. There is no left ventricular hypertrophy. Unable to exclude regional wall motion abnormality.  3. Left ventricular diastolic parameters are consistent with Grade I diastolic dysfunction (impaired relaxation).  4. Global right ventricle has normal systolic function.The right ventricular size is normal. No increase in right ventricular wall thickness.  5. Left atrial size was normal.  6. Normal pulmonary artery systolic pressure. FINDINGS  Left Ventricle: Left ventricular ejection fraction, by visual estimation, is 50 to 55%. The left ventricle has normal function. The left ventricle demonstrates regional wall motion abnormalities. There is no left ventricular hypertrophy. Left ventricular  diastolic parameters are consistent with Grade I diastolic dysfunction (impaired relaxation). Normal left atrial pressure. Right Ventricle: The right ventricular size is normal. No increase in right ventricular wall thickness. Global RV systolic function is has normal systolic function. The tricuspid regurgitant velocity is 2.09 m/s, and with an assumed right atrial pressure  of 10 mmHg, the estimated right ventricular systolic pressure is normal at 27.5 mmHg. Left Atrium: Left atrial size was normal in size. Right Atrium: Right atrial size was normal in size Pericardium: There is no evidence of pericardial effusion. Mitral Valve: The mitral valve is normal in structure. No evidence of mitral valve regurgitation. No evidence of mitral valve stenosis by observation. Tricuspid Valve: The tricuspid valve is normal in structure. Tricuspid valve regurgitation is mild. Aortic Valve: The aortic valve is normal in structure. Aortic valve regurgitation is not visualized. The aortic valve is structurally normal, with no evidence of sclerosis or stenosis. Aortic valve mean gradient measures 1.0 mmHg. Aortic valve peak gradient measures 2.8 mmHg. Aortic valve area, by VTI measures 3.66 cm. Pulmonic Valve: The pulmonic valve was normal in structure. Pulmonic valve regurgitation is not visualized. Pulmonic regurgitation is not visualized. Aorta: The aortic root, ascending aorta and aortic arch are all structurally normal, with no evidence of dilitation or obstruction. Venous: The inferior vena cava is normal in size with greater than 50% respiratory variability, suggesting right atrial pressure of 3 mmHg. IAS/Shunts: No atrial level shunt detected by color flow Doppler. There is no evidence of a patent foramen ovale. No ventricular septal defect is seen or detected. There is no evidence of an atrial septal defect.  LEFT VENTRICLE PLAX 2D LVIDd:         3.73 cm  Diastology LVIDs:         2.71 cm  LV e' lateral:   5.85 cm/s LV PW:          1.35 cm  LV E/e' lateral: 7.5 LV IVS:        1.15 cm  LV e' medial:    1.31 cm/s LVOT diam:     2.00 cm  LV E/e' medial:  33.4 LV SV:         32 ml LV SV Index:   17.29 LVOT Area:     3.14 cm  RIGHT VENTRICLE RV Basal diam:  2.84 cm LEFT ATRIUM         Index LA diam:    4.00 cm 2.20 cm/m  AORTIC VALVE AV Area (Vmax):    2.65 cm AV Area (Vmean):   2.97 cm AV Area (VTI):     3.66 cm AV Vmax:           83.95 cm/s AV Vmean:          50.600 cm/s AV VTI:            0.124 m AV Peak Grad:      2.8 mmHg AV Mean Grad:      1.0 mmHg LVOT Vmax:         70.90 cm/s LVOT Vmean:        47.800 cm/s LVOT VTI:          0.144 m LVOT/AV VTI ratio: 1.17  AORTA Ao Root diam: 3.30 cm MITRAL VALVE                        TRICUSPID VALVE MV Area (PHT): 6.17 cm  TR Peak grad:   17.5 mmHg MV PHT:        35.67 msec           TR Vmax:        209.00 cm/s MV Decel Time: 123 msec MV E velocity: 43.70 cm/s 103 cm/s  SHUNTS MV A velocity: 90.70 cm/s 70.3 cm/s Systemic VTI:  0.14 m MV E/A ratio:  0.48       1.5       Systemic Diam: 2.00 cm  Julien Nordmann MD Electronically signed by Julien Nordmann MD Signature Date/Time: 08/11/2019/5:31:16 PM    Final     Scheduled Meds: . vitamin C  500 mg Oral BID  . aspirin EC  81 mg Oral Daily  . Chlorhexidine Gluconate Cloth  6 each Topical Daily  . cholecalciferol  2,000 Units Oral Daily  . dexamethasone (DECADRON) injection  6 mg Intravenous q1800  . enoxaparin (LOVENOX) injection  40 mg Subcutaneous Q24H  . famotidine  40 mg Oral Daily  . guaiFENesin  600 mg Oral BID  . insulin aspart  0-15 Units Subcutaneous Q4H  . ivermectin  200 mcg/kg Oral QODAY  . latanoprost  1 drop Both Eyes QHS  . multivitamin with minerals  1 tablet Oral Daily  . tamsulosin  0.4 mg Oral QPC supper  . timolol  1 drop Both Eyes Daily  . zinc sulfate  220 mg Oral Daily   Continuous Infusions: . cefTRIAXone (ROCEPHIN)  IV 2 g (08/12/19 0750)  . doxycycline (VIBRAMYCIN) IV Stopped (08/12/19 0300)   . remdesivir 100 mg in NS 100 mL 100 mg (08/12/19 1011)  . thiamine injection 200 mg (08/11/19 2120)    Assessment/Plan:  1. Acute hypoxic respiratory failure secondary to COVID-19 pneumonia.  The patient is on high flow nasal cannula at 100% FiO2.  Continue remdesivir (day 2).  Patient also started on thiamine, Decadron, vitamin C and zinc.  Critical care specialist started on ivermectin every other day.  Patient empirically on antibiotics also. 2. Left rib pain we will get an x-ray of the ribs to see if he has a fractured ribs.  This could also be pleuritic chest pain from Covid pneumonia.  Steroid should help. 3. Abdominal pain has resolved.  Patient's urine analysis looks negative even though urine culture is growing out Enterococcus.  Will speak with pharmacist about whether or not to change antibiotic. 4. GERD on Pepcid 5. Glaucoma unspecified on eyedrops 6. BPH on Flomax  Code Status:     Code Status Orders  (From admission, onward)         Start     Ordered   09/03/2019 2054  Full code  Continuous     08/20/2019 2054        Code Status History    Date Active Date Inactive Code Status Order ID Comments User Context   08/28/2019 1919 08/26/2019 2054 DNR [id number]  Hannah Beat, MD ED   05/22/2019 1413 05/24/2019 1746 DNR [id number]  Auburn Bilberry, MD Inpatient   05/22/2019 1338 05/22/2019 1413 Full Code [id number]  Auburn Bilberry, MD Inpatient   05/06/2016 1525 05/08/2016 1226 Full Code [id number]  Alford Highland, MD ED   Advance Care Planning Activity     Family Communication: Spoke with the patient's wife on the phone Disposition Plan: To be determined.  Patient too sick to be transferred to [facility] campus at this point since on 100% high flow nasal cannula.  Consultants:  Critical care specialist  Antibiotics:  Rocephin  Zithromax  Time spent: 27 minutes  [place]

## 2019-08-12 NOTE — Consult Note (Signed)
Name: ADRICK KESTLER MRN: [id number] DOB: 1930/04/27    ADMISSION DATE:  08/06/2019 INITIAL CONSULTATION DATE:  08/11/2019  REFERRING MD : Dr. Leslye Peer  Follow Up: Acute respiratory failure due to COVID-19 pneumonia  BRIEF PATIENT DESCRIPTION:  86 year old male admitted 09/04/2019 with acute hypoxic respiratory failure in the setting of COVID-19 pneumonia.  Given his increasing FiO2 requirements on 1/6, he was admitted to stepdown unit with PCCM consultation for potential intubation.  SIGNIFICANT EVENTS:  1/5-presented to [facility] ED 1/6-admission status changed to stepdown due to increasing FiO2 requirements, PCCM consulted 1/7-FiO2 requirements decreasing  STUDIES:  12/30 CTA chest>>1. No CT evidence of pulmonary embolism. 2. Small bilateral pleural effusions, new since the prior CT. 3. Minimal apparent thickening of the pericardium with induration of the pericardial fat. Clinical correlation is recommended to evaluate for pericarditis. 4. Aortic Atherosclerosis  1/5- chest x-ray>>Bilateral pulmonary infiltrates, left side greater than right. Findings most consistent with pneumonia. 1/6-bilateral carotid ultrasound>>Very minimal amount of bilateral atherosclerotic plaque and intimal wall thickening, right greater than left, not resulting in a hemodynamically significant stenosis within either internal carotid Artery. 1/6- 2D Echocardiogram>> 1. Left ventricular ejection fraction, by visual estimation, is 50 to 55%. The left ventricle has normal function. There is no left ventricular hypertrophy. Unable to exclude regional wall motion abnormality.  2. Left ventricular diastolic parameters are consistent with Grade I diastolic dysfunction (impaired relaxation).  3. Global right ventricle has normal systolic function.The right ventricular size is normal. No increase in right ventricular wall thickness.  4. Left atrial size was normal.  5. Normal pulmonary artery systolic  pressure.  CULTURES: SARS-CoV-2 antigen 1/5>> POSITIVE Blood culture x2 1/5>> Urine 1/6>> MRSA PCR 1/6>> negative Strep pneumo urinary antigen 1/6>>Negative Legionella urinary antigens 1/6>>  ANTIBIOTICS: Azithromycin 1/5>>1/5 Ceftriaxone 1/6>> Doxycycline 1/6>> Remdesivir 1/5>>   HISTORY OF PRESENT ILLNESS:   Mr. Lack is a 86 year old male with a past medical history notable for GERD, BPH, glaucoma, postsurgical DVT who presented to [facility] ED on 08/07/2019 with complaints of acute onset of generalized weakness and fatigue, and progressive shortness of breath , dry cough, and intermittent left lower parasternal chest pain. He reports 2 episodes of syncope over the last week and a half.  He reported dry cough and intermittent chest pain for the last couple months.  He had a CTA chest previously on 08/04/2019 which was negative for PE, but concerning for possible pericarditis for which he was placed on p.o. prednisone.  He denied loss of taste or smell, fever or chills, nausea or vomiting.  Upon presentation to the ED he was noted to be tachypneic and hypoxic with O2 sats 90% on room air that decreased to 83%.  He was subsequently placed on high flow nasal cannula at 15 L/min with noted improvement in his O2 sats.  His COVID-19 antigen was positive.  Chest x-ray was concerning for bilateral infiltrates consistent with pneumonia.  He was given 8 mg of IV Decadron and placed on IV remdesivir.  He was admitted to the stepdown unit by the hospitalist for further work-up and treatment of acute hypoxic respiratory failure in the setting of COVID-19 pneumonia.  PCCM is consulted for further management.  PAST MEDICAL HISTORY :   has a past medical history of DVT (deep venous thrombosis) ([place]) (2012), GERD (gastroesophageal reflux disease), Wears dentures, and Wears hearing aid.  has a past surgical history that includes Total hip arthroplasty (Bilateral); Knee arthroscopy; Appendectomy; Cholecystectomy;  Colonoscopy (2006); Cataract extraction w/PHACO (Right, 12/28/2014);  and Upper gi endoscopy (2006). Prior to Admission medications   Medication Sig Start Date End Date Taking? Authorizing Provider  latanoprost (XALATAN) 0.005 % ophthalmic solution INT 1 GTT IN OU QHS 02/19/18  Yes [provider]  Multiple Vitamin (MULTIVITAMIN) capsule Take 1 capsule by mouth daily.   Yes [provider]  pantoprazole (PROTONIX) 40 MG tablet Take 1 tablet (40 mg total) by mouth daily. 05/24/19 05/23/20 Yes Auburn BilberryPatel, Shreyang, MD  predniSONE (DELTASONE) 10 MG tablet Take 10-40 mg by mouth daily. 8 day taper 08/02/19  Yes [provider]  tamsulosin (FLOMAX) 0.4 MG CAPS capsule Take 1 capsule (0.4 mg total) by mouth daily after supper. 05/24/19  Yes Auburn BilberryPatel, Shreyang, MD  timolol (TIMOPTIC) 0.5 % ophthalmic solution INT 1 GTT IN OU QD 05/11/18  Yes [provider]   No Known Allergies  FAMILY HISTORY:  family history includes CAD in his father; Colon cancer in his brother; Liver cancer in his brother; Lung cancer in his brother; Prostate cancer in his brother. SOCIAL HISTORY:  reports that he has never smoked. He has never used smokeless tobacco. He reports that he does not drink alcohol or use drugs.   COVID-19 DISASTER DECLARATION:  FULL CONTACT PHYSICAL EXAMINATION WAS NOT POSSIBLE DUE TO TREATMENT OF COVID-19 AND  CONSERVATION OF PERSONAL PROTECTIVE EQUIPMENT, LIMITED EXAM FINDINGS INCLUDE-  Patient assessed or the symptoms described in the history of present illness.  In the context of the Global COVID-19 pandemic, which necessitated consideration that the patient might be at risk for infection with the SARS-CoV-2 virus that causes COVID-19, Institutional protocols and algorithms that pertain to the evaluation of patients at risk for COVID-19 are in a state of rapid change based on information released by regulatory bodies including the CDC and federal and state  organizations. These policies and algorithms were followed during the patient's care while in hospital.  REVIEW OF SYSTEMS:  A focused review of systems was performed and it is as noted below otherwise negative.  SUBJECTIVE:  Pt reports that his SOB has improved at rest Pleuritic chest pain is markedly improved On 100% HFNC + 100% NRB Appears comfortable  VITAL SIGNS: Temp:  [97.3 F (36.3 C)-98.6 F (37 C)] 97.6 F (36.4 C) (01/07 1700) Pulse Rate:  [63-73] 63 (01/07 0700) Resp:  [18-31] 31 (01/07 0700) BP: (98-129)/(68-78) 106/75 (01/07 1326) SpO2:  [87 %-96 %] 90 % (01/07 2041) FiO2 (%):  [100 %] 100 % (01/07 2041)  PHYSICAL EXAMINATION: General: Acutely ill-appearing elderly male, sitting in bed, on HFNC plus NRB, in no acute distress Neuro: Awake, alert and oriented x3, follows commands, no focal deficits, speech clear, pupils PERRLA HEENT: Atraumatic, normocephalic, neck supple, no JVD, trachea midline, no crepitus Cardiovascular: Sinus rhythm on monitor, 2+ pulses Lungs: Unable to auscultate due to CAPR, even, nonlabored, no accessory muscle use Abdomen: Soft, nontender, nondistended Musculoskeletal: No deformities, normal bulk and tone, no edema Skin: Warm and dry, no obvious rashes, lesions, or ulcerations  Recent Labs  Lab 08/30/2019 1424 08/11/19 0517 08/12/19 0338  NA 132* 136 137  K 4.8 4.1 4.4  CL 95* 103 104  CO2 27 23 23   BUN 16 18 22   CREATININE 0.68 0.63 0.53*  GLUCOSE 140* 172* 146*   Recent Labs  Lab 08/23/2019 1424 08/11/19 0517 08/12/19 0338  HGB 14.9 13.4 13.7  HCT 45.3 40.0 41.7  WBC 4.9 3.2* 7.3  PLT 248 200 232   US Carotid Bilateral  Result Date: 08/11/2019 CLINICAL  DATA:  Syncopal episode. EXAM: BILATERAL CAROTID DUPLEX ULTRASOUND TECHNIQUE: Wallace Cullens scale imaging, color Doppler and duplex ultrasound were performed of bilateral carotid and vertebral arteries in the neck. COMPARISON:  None. FINDINGS: Criteria: Quantification of carotid  stenosis is based on velocity parameters that correlate the residual internal carotid diameter with NASCET-based stenosis levels, using the diameter of the distal internal carotid lumen as the denominator for stenosis measurement. The following velocity measurements were obtained: RIGHT ICA: 87/28 cm/sec CCA: 73/10 cm/sec SYSTOLIC ICA/CCA RATIO:  1.2 ECA: 74 cm/sec LEFT ICA: 71/22 cm/sec CCA: 72/8 cm/sec SYSTOLIC ICA/CCA RATIO:  1.2 ECA: 71 cm/sec RIGHT CAROTID ARTERY: There is a minimal amount of eccentric echogenic plaque involving the proximal aspect the right common carotid artery (image 2). There is a minimal amount of intimal thickening involving the carotid bulb, extending to involve the origin and proximal aspects of the right internal carotid artery (image 24), not resulting in elevated peak systolic velocities within the interrogated course the right internal carotid artery to suggest a hemodynamically significant stenosis. RIGHT VERTEBRAL ARTERY:  Antegrade flow LEFT CAROTID ARTERY: There is a minimal amount of intimal thickening within the left carotid bulb, extending to involve the origin and proximal aspects of the left internal carotid artery (image 57), not resulting in elevated peak systolic velocities within the interrogated portions of the left internal carotid artery to suggest a hemodynamically significant stenosis. LEFT VERTEBRAL ARTERY:  Antegrade flow IMPRESSION: Very minimal amount of bilateral atherosclerotic plaque and intimal wall thickening, right greater than left, not resulting in a hemodynamically significant stenosis within either internal carotid artery. Electronically Signed   By: Simonne Come M.D.   On: 08/11/2019 12:06   DG Chest Port 1 View  Result Date: 08/12/2019 CLINICAL DATA:  COVID positive, left rib pain since fall 2 months prior EXAM: PORTABLE CHEST 1 VIEW COMPARISON:  08/26/2019 chest radiograph. FINDINGS: Right rotated chest radiograph. Stable cardiomediastinal  silhouette with normal heart size. No pneumothorax. No pleural effusion. Hazy patchy opacities in the lower lungs bilaterally are unchanged. IMPRESSION: Stable hazy patchy bilateral lower lung opacities compatible with pneumonia. Electronically Signed   By: Delbert Phenix M.D.   On: 08/12/2019 15:50   ECHOCARDIOGRAM COMPLETE  Result Date: 08/11/2019   ECHOCARDIOGRAM REPORT   Patient Name:   REMINGTON HIGHBAUGH Date of Exam: 08/11/2019 Medical Rec #:  [id number]       Height:       66.0 in Accession #:    [id number]      Weight:       160.0 lb Date of Birth:  [birth year]       BSA:          1.82 m Patient Age:    89 years        BP:           94/76 mmHg Patient Gender: M               HR:           80 bpm. Exam Location:  [facility] Procedure: 2D Echo, Cardiac Doppler and Color Doppler Indications:     Syncope 780.2  History:         Patient has prior history of Echocardiogram examinations, most                  recent 05/23/2019. DVT, pt tested positive for covid 08/28/2019.  Sonographer:     Cristela Blue RDCS (AE) Referring Phys:  [id number] Vanessa Kick A MANSY  Diagnosing Phys: Julien Nordmann MD  Sonographer Comments: No parasternal window, no subcostal window and suboptimal apical window. IMPRESSIONS  1. Challenging images  2. Left ventricular ejection fraction, by visual estimation, is 50 to 55%. The left ventricle has normal function. There is no left ventricular hypertrophy. Unable to exclude regional wall motion abnormality.  3. Left ventricular diastolic parameters are consistent with Grade I diastolic dysfunction (impaired relaxation).  4. Global right ventricle has normal systolic function.The right ventricular size is normal. No increase in right ventricular wall thickness.  5. Left atrial size was normal.  6. Normal pulmonary artery systolic pressure. FINDINGS  Left Ventricle: Left ventricular ejection fraction, by visual estimation, is 50 to 55%. The left ventricle has normal function. The left ventricle demonstrates regional wall  motion abnormalities. There is no left ventricular hypertrophy. Left ventricular diastolic parameters are consistent with Grade I diastolic dysfunction (impaired relaxation). Normal left atrial pressure. Right Ventricle: The right ventricular size is normal. No increase in right ventricular wall thickness. Global RV systolic function is has normal systolic function. The tricuspid regurgitant velocity is 2.09 m/s, and with an assumed right atrial pressure  of 10 mmHg, the estimated right ventricular systolic pressure is normal at 27.5 mmHg. Left Atrium: Left atrial size was normal in size. Right Atrium: Right atrial size was normal in size Pericardium: There is no evidence of pericardial effusion. Mitral Valve: The mitral valve is normal in structure. No evidence of mitral valve regurgitation. No evidence of mitral valve stenosis by observation. Tricuspid Valve: The tricuspid valve is normal in structure. Tricuspid valve regurgitation is mild. Aortic Valve: The aortic valve is normal in structure. Aortic valve regurgitation is not visualized. The aortic valve is structurally normal, with no evidence of sclerosis or stenosis. Aortic valve mean gradient measures 1.0 mmHg. Aortic valve peak gradient measures 2.8 mmHg. Aortic valve area, by VTI measures 3.66 cm. Pulmonic Valve: The pulmonic valve was normal in structure. Pulmonic valve regurgitation is not visualized. Pulmonic regurgitation is not visualized. Aorta: The aortic root, ascending aorta and aortic arch are all structurally normal, with no evidence of dilitation or obstruction. Venous: The inferior vena cava is normal in size with greater than 50% respiratory variability, suggesting right atrial pressure of 3 mmHg. IAS/Shunts: No atrial level shunt detected by color flow Doppler. There is no evidence of a patent foramen ovale. No ventricular septal defect is seen or detected. There is no evidence of an atrial septal defect.  LEFT VENTRICLE PLAX 2D LVIDd:          3.73 cm  Diastology LVIDs:         2.71 cm  LV e' lateral:   5.85 cm/s LV PW:         1.35 cm  LV E/e' lateral: 7.5 LV IVS:        1.15 cm  LV e' medial:    1.31 cm/s LVOT diam:     2.00 cm  LV E/e' medial:  33.4 LV SV:         32 ml LV SV Index:   17.29 LVOT Area:     3.14 cm  RIGHT VENTRICLE RV Basal diam:  2.84 cm LEFT ATRIUM         Index LA diam:    4.00 cm 2.20 cm/m  AORTIC VALVE AV Area (Vmax):    2.65 cm AV Area (Vmean):   2.97 cm AV Area (VTI):     3.66 cm AV Vmax:  83.95 cm/s AV Vmean:          50.600 cm/s AV VTI:            0.124 m AV Peak Grad:      2.8 mmHg AV Mean Grad:      1.0 mmHg LVOT Vmax:         70.90 cm/s LVOT Vmean:        47.800 cm/s LVOT VTI:          0.144 m LVOT/AV VTI ratio: 1.17  AORTA Ao Root diam: 3.30 cm MITRAL VALVE                        TRICUSPID VALVE MV Area (PHT): 6.17 cm             TR Peak grad:   17.5 mmHg MV PHT:        35.67 msec           TR Vmax:        209.00 cm/s MV Decel Time: 123 msec MV E velocity: 43.70 cm/s 103 cm/s  SHUNTS MV A velocity: 90.70 cm/s 70.3 cm/s Systemic VTI:  0.14 m MV E/A ratio:  0.48       1.5       Systemic Diam: 2.00 cm  Julien Nordmann MD Electronically signed by Julien Nordmann MD Signature Date/Time: 08/11/2019/5:31:16 PM    Final     ASSESSMENT / PLAN:  Acute hypoxic respiratory failure secondary to COVID-19 pneumonia -Continue supplemental O2 as needed to maintain O2 sats greater than 80% -Allow for permissive hypoxia as long as patient is asymptomatic -Follow intermittent chest x-ray and ABG as needed -CTA chest 12/30  Negative for PE -Follow inflammatory markers ~currently downtrending -Continue Rocephin, doxycycline and ivermectin added 1/6 -Continue IV remdesivir -Continue IV steroids -As needed bronchodilators (MDI) -Vitamin C and zinc -Antitussives -Encourage self proning as tolerated for up to 16 hours/day -Incentive spirometry  -High risk for intubation  Multifocal pneumonia, ?  Superimposed  bacterial pneumonia -Monitor fever curve -Trend WBCs and procalcitonin -Follow cultures as above -Continue Rocephin, placed on doxycycline 1/6  Syncope Chest pain,pleuritic - resoving -Suspect related to COVID-19 illness -Continuous cardiac monitoring -Maintain MAP greater than 65 -Gentle IV fluids -Carotid ultrasound with no significant stenosis -2D echocardiogram pending -Follow high sensitivity troponin (6 ~ 5 ~ 5) -As needed sublingual nitroglycerin and morphine  Hyperglycemia -CBGs -Sliding scale insulin as needed -Follow ICU hypo-/hyperglycemia protocol  GERD -Continue Pepcid  BPH -Continue Flomax  Patient discussed during multidisciplinary rounds with ICU team  Disposition: Stepdown Goals of care: Full code VTE prophylaxis: Subcu Lovenox   C. Danice Goltz, MD [place] [facility] 08/12/2019, 9:08 PM

## 2019-08-13 ENCOUNTER — Inpatient Hospital Stay: Payer: Self-pay

## 2019-08-13 DIAGNOSIS — R531 Weakness: Secondary | ICD-10-CM

## 2019-08-13 DIAGNOSIS — I319 Disease of pericardium, unspecified: Secondary | ICD-10-CM

## 2019-08-13 LAB — CBC WITH DIFFERENTIAL/PLATELET
Abs Immature Granulocytes: 0.05 10*3/uL (ref 0.00–0.07)
Basophils Absolute: 0 10*3/uL (ref 0.0–0.1)
Basophils Relative: 0 %
Eosinophils Absolute: 0 10*3/uL (ref 0.0–0.5)
Eosinophils Relative: 0 %
HCT: 42.8 % (ref 39.0–52.0)
Hemoglobin: 14.2 g/dL (ref 13.0–17.0)
Immature Granulocytes: 1 %
Lymphocytes Relative: 8 %
Lymphs Abs: 0.7 10*3/uL (ref 0.7–4.0)
MCH: 29.5 pg (ref 26.0–34.0)
MCHC: 33.2 g/dL (ref 30.0–36.0)
MCV: 89 fL (ref 80.0–100.0)
Monocytes Absolute: 0.5 10*3/uL (ref 0.1–1.0)
Monocytes Relative: 6 %
Neutro Abs: 7.6 10*3/uL (ref 1.7–7.7)
Neutrophils Relative %: 85 %
Platelets: 264 10*3/uL (ref 150–400)
RBC: 4.81 MIL/uL (ref 4.22–5.81)
RDW: 12.7 % (ref 11.5–15.5)
WBC: 8.9 10*3/uL (ref 4.0–10.5)
nRBC: 0 % (ref 0.0–0.2)

## 2019-08-13 LAB — GLUCOSE, CAPILLARY
Glucose-Capillary: 116 mg/dL — ABNORMAL HIGH (ref 70–99)
Glucose-Capillary: 103 mg/dL — ABNORMAL HIGH (ref 70–99)
Glucose-Capillary: 116 mg/dL — ABNORMAL HIGH (ref 70–99)
Glucose-Capillary: 158 mg/dL — ABNORMAL HIGH (ref 70–99)

## 2019-08-13 LAB — COMPREHENSIVE METABOLIC PANEL
ALT: 63 U/L — ABNORMAL HIGH (ref 0–44)
AST: 78 U/L — ABNORMAL HIGH (ref 15–41)
Albumin: 2.5 g/dL — ABNORMAL LOW (ref 3.5–5.0)
Alkaline Phosphatase: 67 U/L (ref 38–126)
Anion gap: 12 (ref 5–15)
BUN: 22 mg/dL (ref 8–23)
CO2: 21 mmol/L — ABNORMAL LOW (ref 22–32)
Calcium: 8.1 mg/dL — ABNORMAL LOW (ref 8.9–10.3)
Chloride: 103 mmol/L (ref 98–111)
Creatinine, Ser: 0.63 mg/dL (ref 0.61–1.24)
GFR calc Af Amer: >60 mL/min (ref >60)
GFR calc non Af Amer: >60 mL/min (ref >60)
Glucose, Bld: 117 mg/dL — ABNORMAL HIGH (ref 70–99)
Potassium: 4.1 mmol/L (ref 3.5–5.1)
Sodium: 136 mmol/L (ref 135–145)
Total Bilirubin: 0.7 mg/dL (ref 0.3–1.2)
Total Protein: 5.9 g/dL — ABNORMAL LOW (ref 6.5–8.1)

## 2019-08-13 LAB — URINE CULTURE
Culture: >=100000 — AB
Special Requests: NORMAL

## 2019-08-13 LAB — LEGIONELLA PNEUMOPHILA SEROGP 1 UR AG: L. pneumophila Serogp 1 Ur Ag: NEGATIVE

## 2019-08-13 LAB — PROCALCITONIN: Procalcitonin: <0.1 ng/mL

## 2019-08-13 LAB — FERRITIN: Ferritin: 2146 ng/mL — ABNORMAL HIGH (ref 24–336)

## 2019-08-13 LAB — C-REACTIVE PROTEIN: CRP: 4.3 mg/dL — ABNORMAL HIGH (ref <1.0)

## 2019-08-13 LAB — FIBRIN DERIVATIVES D-DIMER (ARMC ONLY): Fibrin derivatives D-dimer (ARMC): 539.3 ng/mL (FEU) — ABNORMAL HIGH (ref 0.00–499.00)

## 2019-08-13 MED ORDER — SODIUM CHLORIDE 0.9 % IV SOLN
100.0000 mg | Freq: Once | INTRAVENOUS | Status: AC
  Administered 2019-08-13: 100 mg via INTRAVENOUS
  Filled 2019-08-13: qty 100

## 2019-08-13 MED ORDER — COLCHICINE 0.6 MG PO TABS
0.6000 mg | ORAL_TABLET | Freq: Two times a day (BID) | ORAL | Status: DC
  Administered 2019-08-13 – 2019-08-15 (×5): 0.6 mg via ORAL
  Filled 2019-08-13 (×6): qty 1

## 2019-08-13 NOTE — Progress Notes (Signed)
[place]. Very short of breath. Up in room to chair x 2 hours.

## 2019-08-13 NOTE — Consult Note (Signed)
Name: AZIZ SLAPE MRN: [id number] DOB: 1929/11/05    ADMISSION DATE:  08/19/2019 INITIAL CONSULTATION DATE:  08/11/2019  REFERRING MD : Dr. Leslye Peer  Follow Up: Acute respiratory failure due to COVID-19 pneumonia  BRIEF PATIENT DESCRIPTION:  86 year old male admitted 08/28/2019 with acute hypoxic respiratory failure in the setting of COVID-19 pneumonia.  Given his increasing FiO2 requirements on 1/6, he was admitted to stepdown unit with PCCM consultation for potential intubation.  SIGNIFICANT EVENTS:  1/5-presented to [facility] ED 1/6-admission status changed to stepdown due to increasing FiO2 requirements, PCCM consulted 1/7-FiO2 requirements decreasing  STUDIES:  12/30 CTA chest>>1. No CT evidence of pulmonary embolism. 2. Small bilateral pleural effusions, new since the prior CT. 3. Minimal apparent thickening of the pericardium with induration of the pericardial fat. Clinical correlation is recommended to evaluate for pericarditis. 4. Aortic Atherosclerosis  1/5- chest x-ray>>Bilateral pulmonary infiltrates, left side greater than right. Findings most consistent with pneumonia. 1/6-bilateral carotid ultrasound>>Very minimal amount of bilateral atherosclerotic plaque and intimal wall thickening, right greater than left, not resulting in a hemodynamically significant stenosis within either internal carotid Artery. 1/6- 2D Echocardiogram>> 1. Left ventricular ejection fraction, by visual estimation, is 50 to 55%. The left ventricle has normal function. There is no left ventricular hypertrophy. Unable to exclude regional wall motion abnormality.  2. Left ventricular diastolic parameters are consistent with Grade I diastolic dysfunction (impaired relaxation).  3. Global right ventricle has normal systolic function.The right ventricular size is normal. No increase in right ventricular wall thickness.  4. Left atrial size was normal.  5. Normal pulmonary artery systolic  pressure.  CULTURES: SARS-CoV-2 antigen 1/5>> POSITIVE Blood culture x2 1/5>> Urine 1/6>> MRSA PCR 1/6>> negative Strep pneumo urinary antigen 1/6>>Negative Legionella urinary antigens 1/6>>  ANTIBIOTICS: Azithromycin 1/5>>1/5 Ceftriaxone 1/6>> Doxycycline 1/6>> Remdesivir 1/5>>  CC follow up Resp failure  HPI Remains hypoxic resting comfortably 100% NRB mask not on high flow  PAST MEDICAL HISTORY :   has a past medical history of DVT (deep venous thrombosis) ([place]) (2012), GERD (gastroesophageal reflux disease), Wears dentures, and Wears hearing aid.  has a past surgical history that includes Total hip arthroplasty (Bilateral); Knee arthroscopy; Appendectomy; Cholecystectomy; Colonoscopy (2006); Cataract extraction w/PHACO (Right, 12/28/2014); and Upper gi endoscopy (2006). Prior to Admission medications   Medication Sig Start Date End Date Taking? Authorizing Provider  latanoprost (XALATAN) 0.005 % ophthalmic solution INT 1 GTT IN OU QHS 02/19/18  Yes [provider]  Multiple Vitamin (MULTIVITAMIN) capsule Take 1 capsule by mouth daily.   Yes [provider]  pantoprazole (PROTONIX) 40 MG tablet Take 1 tablet (40 mg total) by mouth daily. 05/24/19 05/23/20 Yes Dustin Flock, MD  predniSONE (DELTASONE) 10 MG tablet Take 10-40 mg by mouth daily. 8 day taper 08/02/19  Yes [provider]  tamsulosin (FLOMAX) 0.4 MG CAPS capsule Take 1 capsule (0.4 mg total) by mouth daily after supper. 05/24/19  Yes Dustin Flock, MD  timolol (TIMOPTIC) 0.5 % ophthalmic solution INT 1 GTT IN OU QD 05/11/18  Yes [provider]   No Known Allergies  FAMILY HISTORY:  family history includes CAD in his father; Colon cancer in his brother; Liver cancer in his brother; Lung cancer in his brother; Prostate cancer in his brother. SOCIAL HISTORY:  reports that he has never smoked. He has never used smokeless tobacco. He reports that he does not drink alcohol or use  drugs.  COVID-19 DISASTER DECLARATION:   FULL CONTACT PHYSICAL EXAMINATION WAS NOT POSSIBLE DUE  TO TREATMENT OF COVID-19 AND   CONSERVATION OF PERSONAL PROTECTIVE EQUIPMENT, LIMITED EXAM FINDINGS INCLUDE-   Patient assessed or the symptoms described in the history of present illness.   In the context of the Global COVID-19 pandemic, which necessitated consideration that the patient might be at risk for infection with the SARS-CoV-2 virus that causes COVID-19, Institutional protocols and algorithms that pertain to the evaluation of patients at risk for COVID-19 are in a state of rapid change based on information released by regulatory bodies including the CDC and federal and state organizations. These policies and algorithms were followed during the patient's care while in hospital.     REVIEW OF SYSTEMS:  A focused review of systems was performed and it is as noted below otherwise negative.   VITAL SIGNS: Temp:  [97.6 F (36.4 C)-98.9 F (37.2 C)] 98.9 F (37.2 C) (01/08 0800) Pulse Rate:  [65-80] 69 (01/08 1100) Resp:  [20-36] 29 (01/08 1100) BP: (100-131)/(59-87) 100/80 (01/08 1100) SpO2:  [85 %-98 %] 86 % (01/08 1100) FiO2 (%):  [70 %-100 %] 70 % (01/08 1403)   Recent Labs  Lab 08/11/19 0517 08/12/19 0338 08/13/19 0437  NA 136 137 136  K 4.1 4.4 4.1  CL 103 104 103  CO2 23 23 21*  BUN 18 22 22   CREATININE 0.63 0.53* 0.63  GLUCOSE 172* 146* 117*   Recent Labs  Lab 08/11/19 0517 08/12/19 0338 08/13/19 0437  HGB 13.4 13.7 14.2  HCT 40.0 41.7 42.8  WBC 3.2* 7.3 8.9  PLT 200 232 264   DG Chest Port 1 View  Result Date: 08/12/2019 CLINICAL DATA:  COVID positive, left rib pain since fall 2 months prior EXAM: PORTABLE CHEST 1 VIEW COMPARISON:  September 07, 2019 chest radiograph. FINDINGS: Right rotated chest radiograph. Stable cardiomediastinal silhouette with normal heart size. No pneumothorax. No pleural effusion. Hazy patchy opacities in the lower lungs bilaterally are  unchanged. IMPRESSION: Stable hazy patchy bilateral lower lung opacities compatible with pneumonia. Electronically Signed   By: 10/08/2019 M.D.   On: 08/12/2019 15:50   ECHOCARDIOGRAM COMPLETE  Result Date: 08/11/2019   ECHOCARDIOGRAM REPORT   Patient Name:   LINKEN MCGLOTHEN Date of Exam: 08/11/2019 Medical Rec #:  10/09/2019       Height:       66.0 in Accession #:    [id number]      Weight:       160.0 lb Date of Birth:  [birth year]       BSA:          1.82 m Patient Age:    89 years        BP:           94/76 mmHg Patient Gender: M               HR:           80 bpm. Exam Location:  [facility] Procedure: 2D Echo, Cardiac Doppler and Color Doppler Indications:     Syncope 780.2  History:         Patient has prior history of Echocardiogram examinations, most                  recent 05/23/2019. DVT, pt tested positive for covid 2019-09-07.  Sonographer:     11-21-1983 RDCS (AE) Referring Phys:  Cristela Blue [id number] MANSY Diagnosing Phys: Vernetta Honey MD  Sonographer Comments: No parasternal window, no subcostal window and suboptimal apical window. IMPRESSIONS  1.  Challenging images  2. Left ventricular ejection fraction, by visual estimation, is 50 to 55%. The left ventricle has normal function. There is no left ventricular hypertrophy. Unable to exclude regional wall motion abnormality.  3. Left ventricular diastolic parameters are consistent with Grade I diastolic dysfunction (impaired relaxation).  4. Global right ventricle has normal systolic function.The right ventricular size is normal. No increase in right ventricular wall thickness.  5. Left atrial size was normal.  6. Normal pulmonary artery systolic pressure. FINDINGS  Left Ventricle: Left ventricular ejection fraction, by visual estimation, is 50 to 55%. The left ventricle has normal function. The left ventricle demonstrates regional wall motion abnormalities. There is no left ventricular hypertrophy. Left ventricular diastolic parameters are consistent with Grade I  diastolic dysfunction (impaired relaxation). Normal left atrial pressure. Right Ventricle: The right ventricular size is normal. No increase in right ventricular wall thickness. Global RV systolic function is has normal systolic function. The tricuspid regurgitant velocity is 2.09 m/s, and with an assumed right atrial pressure  of 10 mmHg, the estimated right ventricular systolic pressure is normal at 27.5 mmHg. Left Atrium: Left atrial size was normal in size. Right Atrium: Right atrial size was normal in size Pericardium: There is no evidence of pericardial effusion. Mitral Valve: The mitral valve is normal in structure. No evidence of mitral valve regurgitation. No evidence of mitral valve stenosis by observation. Tricuspid Valve: The tricuspid valve is normal in structure. Tricuspid valve regurgitation is mild. Aortic Valve: The aortic valve is normal in structure. Aortic valve regurgitation is not visualized. The aortic valve is structurally normal, with no evidence of sclerosis or stenosis. Aortic valve mean gradient measures 1.0 mmHg. Aortic valve peak gradient measures 2.8 mmHg. Aortic valve area, by VTI measures 3.66 cm. Pulmonic Valve: The pulmonic valve was normal in structure. Pulmonic valve regurgitation is not visualized. Pulmonic regurgitation is not visualized. Aorta: The aortic root, ascending aorta and aortic arch are all structurally normal, with no evidence of dilitation or obstruction. Venous: The inferior vena cava is normal in size with greater than 50% respiratory variability, suggesting right atrial pressure of 3 mmHg. IAS/Shunts: No atrial level shunt detected by color flow Doppler. There is no evidence of a patent foramen ovale. No ventricular septal defect is seen or detected. There is no evidence of an atrial septal defect.  LEFT VENTRICLE PLAX 2D LVIDd:         3.73 cm  Diastology LVIDs:         2.71 cm  LV e' lateral:   5.85 cm/s LV PW:         1.35 cm  LV E/e' lateral: 7.5 LV IVS:         1.15 cm  LV e' medial:    1.31 cm/s LVOT diam:     2.00 cm  LV E/e' medial:  33.4 LV SV:         32 ml LV SV Index:   17.29 LVOT Area:     3.14 cm  RIGHT VENTRICLE RV Basal diam:  2.84 cm LEFT ATRIUM         Index LA diam:    4.00 cm 2.20 cm/m  AORTIC VALVE AV Area (Vmax):    2.65 cm AV Area (Vmean):   2.97 cm AV Area (VTI):     3.66 cm AV Vmax:           83.95 cm/s AV Vmean:          50.600 cm/s AV  VTI:            0.124 m AV Peak Grad:      2.8 mmHg AV Mean Grad:      1.0 mmHg LVOT Vmax:         70.90 cm/s LVOT Vmean:        47.800 cm/s LVOT VTI:          0.144 m LVOT/AV VTI ratio: 1.17  AORTA Ao Root diam: 3.30 cm MITRAL VALVE                        TRICUSPID VALVE MV Area (PHT): 6.17 cm             TR Peak grad:   17.5 mmHg MV PHT:        35.67 msec           TR Vmax:        209.00 cm/s MV Decel Time: 123 msec MV E velocity: 43.70 cm/s 103 cm/s  SHUNTS MV A velocity: 90.70 cm/s 70.3 cm/s Systemic VTI:  0.14 m MV E/A ratio:  0.48       1.5       Systemic Diam: 2.00 cm  Julien Nordmann MD Electronically signed by Julien Nordmann MD Signature Date/Time: 08/11/2019/5:31:16 PM    Final    [place] EKG SITE RITE  Result Date: 08/13/2019 If Site Rite image not attached, placement could not be confirmed due to current cardiac rhythm.   ASSESSMENT / PLAN:  Severe ACUTE Hypoxic  Respiratory Failure ARDS, COVID 19 pneumonia  Severe COVID-19 infection, ARDS and pneumonia/pneumonitis Continue IV steroids  IV remdisivir Aggressive pulm toilet recommended OOB to chair as tolerated  Pulmonary hygiene Continue proning as tolerated due to severe hypoxia   Maintain airborne and contact precautions  As needed bronchodilators (MDI) Vitamin C and zinc Antitussives High risk for intubation and death  MULTIFOCAL PNEUMONIA Continue IV abx as prescribed  ELECTROLYTES -follow labs as needed -replace as needed -pharmacy consultation and following    DVT/GI PRX ordered TRANSFUSIONS AS NEEDED MONITOR  FSBS ASSESS the need for LABS as needed  Patient discussed during multidisciplinary rounds with ICU team  Disposition: Stepdown Goals of care: Full code VTE prophylaxis: Subcu Lovenox    Lucie Leather, M.D.  [facility]  Medical Director [facility] Medical Director [facility] Cardio-Pulmonary Department

## 2019-08-13 NOTE — Progress Notes (Signed)
Patient ID: Bill Cooper, male   DOB: [birth year], 86 y.o.   MRN: [id number] [facility] PROGRESS NOTE  Bill Cooper:[id number] DOB: Feb 17, 1930 DOA: 08/06/2019 PCP: Lynnea Ferrier, MD  HPI/Subjective: Patient still with shortness of breath and cough.  Was taken off high flow nasal cannula and put on 100% nonrebreather and still hypoxic.  Patient feeling very weak.  Spoke with stepdaughter on the phone.  She stated that he was recently diagnosed with pericarditis and started on empiric colchicine.  Objective: Vitals:   08/13/19 1000 08/13/19 1100  BP: 110/73 100/80  Pulse: 80 69  Resp: (!) 32 (!) 29  Temp:    SpO2: (!) 85% (!) 86%    Intake/Output Summary (Last 24 hours) at 08/13/2019 1236 Last data filed at 08/13/2019 1100 Gross per 24 hour  Intake 929.98 ml  Output 1550 ml  Net -620.02 ml   Filed Weights   08/31/2019 1415  Weight: 72.6 kg    ROS: Review of Systems  Constitutional: Negative for chills and fever.  Eyes: Negative for blurred vision.  Respiratory: Positive for cough and shortness of breath.   Cardiovascular: Positive for chest pain.  Gastrointestinal: Negative for abdominal pain, constipation, diarrhea, nausea and vomiting.  Genitourinary: Negative for dysuria.  Musculoskeletal: Negative for joint pain.  Neurological: Negative for dizziness and headaches.   Exam: Physical Exam  Constitutional: He is oriented to person, place, and time.  HENT:  Nose: No mucosal edema.  Mouth/Throat: No oropharyngeal exudate or posterior oropharyngeal edema.  Eyes: Pupils are equal, round, and reactive to light. Conjunctivae, EOM and lids are normal.  Neck: Carotid bruit is not present.  Cardiovascular: S1 normal and S2 normal. Exam reveals no gallop.  No murmur heard. Pulses:      Dorsalis pedis pulses are 2+ on the right side and 2+ on the left side.  Respiratory: He has decreased breath sounds in the right middle field, the right lower field, the left middle  field and the left lower field. He has no wheezes. He has no rhonchi. He has no rales.  GI: Soft. Bowel sounds are normal. There is no abdominal tenderness.  Musculoskeletal:     Right ankle: No swelling.     Left ankle: No swelling.  Lymphadenopathy:    He has no cervical adenopathy.  Neurological: He is alert and oriented to person, place, and time. No cranial nerve deficit.  Skin: Skin is warm. No rash noted. Nails show no clubbing.  Psychiatric: He has a normal mood and affect.      Data Reviewed: Basic Metabolic Panel: Recent Labs  Lab 08/29/2019 1424 08/11/19 0517 08/12/19 0338 08/13/19 0437  NA 132* 136 137 136  K 4.8 4.1 4.4 4.1  CL 95* 103 104 103  CO2 27 23 23  21*  GLUCOSE 140* 172* 146* 117*  BUN 16 18 22 22   CREATININE 0.68 0.63 0.53* 0.63  CALCIUM 8.4* 8.0* 8.2* 8.1*   Liver Function Tests: Recent Labs  Lab 08/11/2019 1424 08/31/2019 1655 08/11/19 0517 08/12/19 0338 08/13/19 0437  AST  --  55* 43* 41 78*  ALT 35 34 31 30 63*  ALKPHOS  --  75 67 64 67  BILITOT  --  0.8 0.7 0.6 0.7  PROT  --  6.8 5.8* 5.7* 5.9*  ALBUMIN  --  3.0* 2.5* 2.3* 2.5*   Recent Labs  Lab 08/09/2019 1424  LIPASE 24   CBC: Recent Labs  Lab 09/01/2019 1424 08/11/19 0517 08/12/19  [id number] 08/13/19 0437  WBC 4.9 3.2* 7.3 8.9  NEUTROABS  --  2.4 6.1 7.6  HGB 14.9 13.4 13.7 14.2  HCT 45.3 40.0 41.7 42.8  MCV 90.2 89.1 89.5 89.0  PLT 248 200 232 264     Recent Results (from the past 240 hour(s))  Culture, blood (Routine X 2) w Reflex to ID Panel     Status: None (Preliminary result)   Collection Time: 16-Sep-2019  9:58 PM   Specimen: BLOOD  Result Value Ref Range Status   Specimen Description BLOOD LEFT FOREARM  Final   Special Requests   Final    BOTTLES DRAWN AEROBIC AND ANAEROBIC Blood Culture adequate volume   Culture   Final    NO GROWTH 3 DAYS Performed at [facility], [address] [place], [place] [address]    Report Status PENDING  Incomplete  Culture,  blood (Routine X 2) w Reflex to ID Panel     Status: None (Preliminary result)   Collection Time: 16-Sep-2019  9:58 PM   Specimen: BLOOD  Result Value Ref Range Status   Specimen Description BLOOD LEFT ASSIST CONTROL  Final   Special Requests   Final    BOTTLES DRAWN AEROBIC AND ANAEROBIC Blood Culture results may not be optimal due to an excessive volume of blood received in culture bottles   Culture   Final    NO GROWTH 3 DAYS Performed at [facility], [address] [place], [place] [address]    Report Status PENDING  Incomplete  Urine Culture     Status: Abnormal   Collection Time: 08/11/19  9:33 AM   Specimen: Urine, Clean Catch  Result Value Ref Range Status   Specimen Description   Final    URINE, CLEAN CATCH Performed at [facility], [address] [place], [place] [address]    Special Requests   Final    Normal Performed at [facility], [address] [place], [place] [address]    Culture >=100,000 COLONIES/mL ENTEROCOCCUS FAECALIS (A)  Final   Report Status 08/13/2019 FINAL  Final   Organism ID, Bacteria ENTEROCOCCUS FAECALIS (A)  Final      Susceptibility   Enterococcus faecalis - MIC*    AMPICILLIN <=2 SENSITIVE Sensitive     NITROFURANTOIN <=16 SENSITIVE Sensitive     VANCOMYCIN 1 SENSITIVE Sensitive     * >=100,000 COLONIES/mL ENTEROCOCCUS FAECALIS  MRSA PCR Screening     Status: None   Collection Time: 08/11/19  3:25 PM   Specimen: Nasal Mucosa; Nasopharyngeal  Result Value Ref Range Status   MRSA by PCR NEGATIVE NEGATIVE Final    Comment:        The GeneXpert MRSA Assay (FDA approved for NASAL specimens only), is one component of a comprehensive MRSA colonization surveillance program. It is not intended to diagnose MRSA infection nor to guide or monitor treatment for MRSA infections. Performed at [facility], [address] [place], [place] [address]      [facility] [place] [facility]  Result Date:  08/12/2019 CLINICAL DATA:  COVID positive, left rib pain since fall 2 months prior EXAM: PORTABLE CHEST 1 VIEW COMPARISON:  07-26-2020 chest radiograph. FINDINGS: Right rotated chest radiograph. Stable cardiomediastinal silhouette with normal heart size. No pneumothorax. No pleural effusion. Hazy patchy opacities in the lower lungs bilaterally are unchanged. IMPRESSION: Stable hazy patchy bilateral lower lung opacities compatible with pneumonia. Electronically Signed   By: Jannifer RodneyJason A Poff M.D.  On: 08/12/2019 15:50   ECHOCARDIOGRAM COMPLETE  Result Date: 08/11/2019   ECHOCARDIOGRAM REPORT   Patient Name:   Bill Cooper Date of Exam: 08/11/2019 Medical Rec #:  [id number]       Height:       66.0 in Accession #:    [id number]      Weight:       160.0 lb Date of Birth:  05-21-1930       BSA:          1.82 m Patient Age:    89 years        BP:           94/76 mmHg Patient Gender: M               HR:           80 bpm. Exam Location:  [facility] Procedure: 2D Echo, Cardiac Doppler and Color Doppler Indications:     Syncope 780.2  History:         Patient has prior history of Echocardiogram examinations, most                  recent 05/23/2019. DVT, pt tested positive for covid 2019/08/31.  Sonographer:     Cristela Blue RDCS (AE) Referring Phys:  [id number] Vernetta Honey MANSY Diagnosing Phys: Julien Nordmann MD  Sonographer Comments: No parasternal window, no subcostal window and suboptimal apical window. IMPRESSIONS  1. Challenging images  2. Left ventricular ejection fraction, by visual estimation, is 50 to 55%. The left ventricle has normal function. There is no left ventricular hypertrophy. Unable to exclude regional wall motion abnormality.  3. Left ventricular diastolic parameters are consistent with Grade I diastolic dysfunction (impaired relaxation).  4. Global right ventricle has normal systolic function.The right ventricular size is normal. No increase in right ventricular wall thickness.  5. Left atrial size was normal.  6. Normal  pulmonary artery systolic pressure. FINDINGS  Left Ventricle: Left ventricular ejection fraction, by visual estimation, is 50 to 55%. The left ventricle has normal function. The left ventricle demonstrates regional wall motion abnormalities. There is no left ventricular hypertrophy. Left ventricular diastolic parameters are consistent with Grade I diastolic dysfunction (impaired relaxation). Normal left atrial pressure. Right Ventricle: The right ventricular size is normal. No increase in right ventricular wall thickness. Global RV systolic function is has normal systolic function. The tricuspid regurgitant velocity is 2.09 m/s, and with an assumed right atrial pressure  of 10 mmHg, the estimated right ventricular systolic pressure is normal at 27.5 mmHg. Left Atrium: Left atrial size was normal in size. Right Atrium: Right atrial size was normal in size Pericardium: There is no evidence of pericardial effusion. Mitral Valve: The mitral valve is normal in structure. No evidence of mitral valve regurgitation. No evidence of mitral valve stenosis by observation. Tricuspid Valve: The tricuspid valve is normal in structure. Tricuspid valve regurgitation is mild. Aortic Valve: The aortic valve is normal in structure. Aortic valve regurgitation is not visualized. The aortic valve is structurally normal, with no evidence of sclerosis or stenosis. Aortic valve mean gradient measures 1.0 mmHg. Aortic valve peak gradient measures 2.8 mmHg. Aortic valve area, by VTI measures 3.66 cm. Pulmonic Valve: The pulmonic valve was normal in structure. Pulmonic valve regurgitation is not visualized. Pulmonic regurgitation is not visualized. Aorta: The aortic root, ascending aorta and aortic arch are all structurally normal, with no evidence of dilitation or obstruction. Venous: The inferior vena cava is normal in  size with greater than 50% respiratory variability, suggesting right atrial pressure of 3 mmHg. IAS/Shunts: No atrial level  shunt detected by color flow Doppler. There is no evidence of a patent foramen ovale. No ventricular septal defect is seen or detected. There is no evidence of an atrial septal defect.  LEFT VENTRICLE PLAX 2D LVIDd:         3.73 cm  Diastology LVIDs:         2.71 cm  LV e' lateral:   5.85 cm/s LV PW:         1.35 cm  LV E/e' lateral: 7.5 LV IVS:        1.15 cm  LV e' medial:    1.31 cm/s LVOT diam:     2.00 cm  LV E/e' medial:  33.4 LV SV:         32 ml LV SV Index:   17.29 LVOT Area:     3.14 cm  RIGHT VENTRICLE RV Basal diam:  2.84 cm LEFT ATRIUM         Index LA diam:    4.00 cm 2.20 cm/m  AORTIC VALVE AV Area (Vmax):    2.65 cm AV Area (Vmean):   2.97 cm AV Area (VTI):     3.66 cm AV Vmax:           83.95 cm/s AV Vmean:          50.600 cm/s AV VTI:            0.124 m AV Peak Grad:      2.8 mmHg AV Mean Grad:      1.0 mmHg LVOT Vmax:         70.90 cm/s LVOT Vmean:        47.800 cm/s LVOT VTI:          0.144 m LVOT/AV VTI ratio: 1.17  AORTA Ao Root diam: 3.30 cm MITRAL VALVE                        TRICUSPID VALVE MV Area (PHT): 6.17 cm             TR Peak grad:   17.5 mmHg MV PHT:        35.67 msec           TR Vmax:        209.00 cm/s MV Decel Time: 123 msec MV E velocity: 43.70 cm/s 103 cm/s  SHUNTS MV A velocity: 90.70 cm/s 70.3 cm/s Systemic VTI:  0.14 m MV E/A ratio:  0.48       1.5       Systemic Diam: 2.00 cm  Ida Rogue MD Electronically signed by Ida Rogue MD Signature Date/Time: 08/11/2019/5:31:16 PM    Final     Scheduled Meds: . amoxicillin-clavulanate  1 tablet Oral Q12H  . vitamin C  500 mg Oral BID  . aspirin EC  81 mg Oral Daily  . Chlorhexidine Gluconate Cloth  6 each Topical Daily  . cholecalciferol  2,000 Units Oral Daily  . dexamethasone (DECADRON) injection  6 mg Intravenous q1800  . enoxaparin (LOVENOX) injection  40 mg Subcutaneous Q24H  . famotidine  40 mg Oral Daily  . guaiFENesin  600 mg Oral BID  . insulin aspart  0-15 Units Subcutaneous Q4H  . latanoprost  1  drop Both Eyes QHS  . multivitamin with minerals  1 tablet Oral Daily  . tamsulosin  0.4 mg Oral  QPC supper  . timolol  1 drop Both Eyes Daily  . zinc sulfate  220 mg Oral Daily   Continuous Infusions: . doxycycline (VIBRAMYCIN) IV Stopped (08/13/19 0225)  . remdesivir 100 mg in NS 100 mL 100 mg (08/12/19 1011)  . thiamine injection Stopped (08/12/19 2211)    Assessment/Plan:  1. Acute hypoxic respiratory failure secondary to COVID-19 pneumonia.  The patient is on 100% nonrebreather and still hypoxic.  Continue remdesivir (day 3).  Patient also started on thiamine, Decadron, vitamin C and zinc.  Critical care specialist gave ivermectin.  Patient empirically on Augmentin and doxycycline.  Case discussed with critical care specialist. 2. Left rib pain we will get an x-ray of the ribs to see if he has a fractured ribs.  This could also be pleuritic chest pain from Covid pneumonia.  Steroid should help. 3. Daughter-in-law states that the patient was recently diagnosed with pericarditis and started empirically on colchicine and I will add that. 4. UTI with Enterococcus.  Patient on Augmentin 5. GERD on Pepcid 6. Glaucoma unspecified on eyedrops 7. BPH on Flomax  Code Status:     Code Status Orders  (From admission, onward)         Start     Ordered   08/26/2019 2054  Full code  Continuous     08/12/2019 2054        Code Status History    Date Active Date Inactive Code Status Order ID Comments User Context   09/03/2019 1919 08/12/2019 2054 DNR [id number]  Hannah Beat, MD ED   05/22/2019 1413 05/24/2019 1746 DNR [id number]  Auburn Bilberry, MD Inpatient   05/22/2019 1338 05/22/2019 1413 Full Code [id number]  Auburn Bilberry, MD Inpatient   05/06/2016 1525 05/08/2016 1226 Full Code [id number]  Alford Highland, MD ED   Advance Care Planning Activity     Family Communication: Spoke with the patient's stepdaughter on the phone Disposition Plan: To be determined.  Consultants:  Critical  care specialist  Antibiotics:  Rocephin  Zithromax  Time spent: 26 minutes  [employer]

## 2019-08-14 DIAGNOSIS — J9621 Acute and chronic respiratory failure with hypoxia: Secondary | ICD-10-CM

## 2019-08-14 DIAGNOSIS — I309 Acute pericarditis, unspecified: Secondary | ICD-10-CM

## 2019-08-14 DIAGNOSIS — J9601 Acute respiratory failure with hypoxia: Secondary | ICD-10-CM

## 2019-08-14 LAB — CBC WITH DIFFERENTIAL/PLATELET
Abs Immature Granulocytes: 0.04 10*3/uL (ref 0.00–0.07)
Basophils Absolute: 0 10*3/uL (ref 0.0–0.1)
Basophils Relative: 0 %
Eosinophils Absolute: 0 10*3/uL (ref 0.0–0.5)
Eosinophils Relative: 0 %
HCT: 41.6 % (ref 39.0–52.0)
Hemoglobin: 13.9 g/dL (ref 13.0–17.0)
Immature Granulocytes: 1 %
Lymphocytes Relative: 9 %
Lymphs Abs: 0.7 10*3/uL (ref 0.7–4.0)
MCH: 29.8 pg (ref 26.0–34.0)
MCHC: 33.4 g/dL (ref 30.0–36.0)
MCV: 89.3 fL (ref 80.0–100.0)
Monocytes Absolute: 0.5 10*3/uL (ref 0.1–1.0)
Monocytes Relative: 6 %
Neutro Abs: 6.6 10*3/uL (ref 1.7–7.7)
Neutrophils Relative %: 84 %
Platelets: 252 10*3/uL (ref 150–400)
RBC: 4.66 MIL/uL (ref 4.22–5.81)
RDW: 12.8 % (ref 11.5–15.5)
WBC: 7.9 10*3/uL (ref 4.0–10.5)
nRBC: 0 % (ref 0.0–0.2)

## 2019-08-14 LAB — COMPREHENSIVE METABOLIC PANEL
ALT: 49 U/L — ABNORMAL HIGH (ref 0–44)
AST: 42 U/L — ABNORMAL HIGH (ref 15–41)
Albumin: 2.4 g/dL — ABNORMAL LOW (ref 3.5–5.0)
Alkaline Phosphatase: 70 U/L (ref 38–126)
Anion gap: 11 (ref 5–15)
BUN: 24 mg/dL — ABNORMAL HIGH (ref 8–23)
CO2: 23 mmol/L (ref 22–32)
Calcium: 8.2 mg/dL — ABNORMAL LOW (ref 8.9–10.3)
Chloride: 103 mmol/L (ref 98–111)
Creatinine, Ser: 0.68 mg/dL (ref 0.61–1.24)
GFR calc Af Amer: >60 mL/min (ref >60)
GFR calc non Af Amer: >60 mL/min (ref >60)
Glucose, Bld: 131 mg/dL — ABNORMAL HIGH (ref 70–99)
Potassium: 4.4 mmol/L (ref 3.5–5.1)
Sodium: 137 mmol/L (ref 135–145)
Total Bilirubin: 0.8 mg/dL (ref 0.3–1.2)
Total Protein: 5.8 g/dL — ABNORMAL LOW (ref 6.5–8.1)

## 2019-08-14 LAB — FERRITIN: Ferritin: 1609 ng/mL — ABNORMAL HIGH (ref 24–336)

## 2019-08-14 LAB — GLUCOSE, CAPILLARY
Glucose-Capillary: 122 mg/dL — ABNORMAL HIGH (ref 70–99)
Glucose-Capillary: 116 mg/dL — ABNORMAL HIGH (ref 70–99)
Glucose-Capillary: 161 mg/dL — ABNORMAL HIGH (ref 70–99)
Glucose-Capillary: 120 mg/dL — ABNORMAL HIGH (ref 70–99)
Glucose-Capillary: 146 mg/dL — ABNORMAL HIGH (ref 70–99)

## 2019-08-14 LAB — FIBRIN DERIVATIVES D-DIMER (ARMC ONLY): Fibrin derivatives D-dimer (ARMC): 525.58 ng/mL (FEU) — ABNORMAL HIGH (ref 0.00–499.00)

## 2019-08-14 LAB — C-REACTIVE PROTEIN: CRP: 8.6 mg/dL — ABNORMAL HIGH (ref <1.0)

## 2019-08-14 MED ORDER — FUROSEMIDE 10 MG/ML IJ SOLN
40.0000 mg | Freq: Once | INTRAMUSCULAR | Status: AC
  Administered 2019-08-14: 40 mg via INTRAVENOUS
  Filled 2019-08-14: qty 4

## 2019-08-14 NOTE — Progress Notes (Signed)
Patient ID: JASPREET HOLLINGS, male   DOB: June 12, 1930, 86 y.o.   MRN: [id number] [facility] PROGRESS NOTE  CARVER MURAKAMI PJK:[id number] DOB: 25-Feb-1930 DOA: 17-Aug-2019 PCP: Adin Hector, MD  HPI/Subjective: Patient still with some shortness of breath and cough.  His left-sided chest pain is better.  Still requiring quite a bit of oxygen on high flow nasal cannula.  Objective: Vitals:   08/14/19 0900 08/14/19 1000  BP: 104/72 117/69  Pulse: 67 68  Resp: (!) 25 18  Temp:    SpO2: 92% 97%    Intake/Output Summary (Last 24 hours) at 08/14/2019 1310 Last data filed at 08/14/2019 1155 Gross per 24 hour  Intake 970 ml  Output 1550 ml  Net -580 ml   Filed Weights   08/18/2019 1415  Weight: 72.6 kg    ROS: Review of Systems  Constitutional: Positive for malaise/fatigue. Negative for chills and fever.  Eyes: Negative for blurred vision.  Respiratory: Positive for cough and shortness of breath.   Cardiovascular: Negative for chest pain.  Gastrointestinal: Negative for abdominal pain, constipation, diarrhea, nausea and vomiting.  Genitourinary: Negative for dysuria.  Musculoskeletal: Negative for joint pain.  Neurological: Negative for dizziness and headaches.   Exam: Physical Exam  Constitutional: He is oriented to person, place, and time.  HENT:  Nose: No mucosal edema.  Mouth/Throat: No oropharyngeal exudate or posterior oropharyngeal edema.  Eyes: Pupils are equal, round, and reactive to light. Conjunctivae, EOM and lids are normal.  Neck: Carotid bruit is not present.  Cardiovascular: S1 normal and S2 normal. Exam reveals no gallop.  No murmur heard. Pulses:      Dorsalis pedis pulses are 2+ on the right side and 2+ on the left side.  Respiratory: He has decreased breath sounds in the right middle field, the right lower field, the left middle field and the left lower field. He has no wheezes. He has no rhonchi. He has no rales.  GI: Soft. Bowel sounds are normal.  There is no abdominal tenderness.  Musculoskeletal:     Right ankle: No swelling.     Left ankle: No swelling.  Lymphadenopathy:    He has no cervical adenopathy.  Neurological: He is alert and oriented to person, place, and time. No cranial nerve deficit.  Skin: Skin is warm. No rash noted. Nails show no clubbing.  Psychiatric: He has a normal mood and affect.      Data Reviewed: Basic Metabolic Panel: Recent Labs  Lab 08/09/2019 1424 08/11/19 0517 08/12/19 0338 08/13/19 0437 08/14/19 0542  NA 132* 136 137 136 137  K 4.8 4.1 4.4 4.1 4.4  CL 95* 103 104 103 103  CO2 27 23 23  21* 23  GLUCOSE 140* 172* 146* 117* 131*  BUN 16 18 22 22  24*  CREATININE 0.68 0.63 0.53* 0.63 0.68  CALCIUM 8.4* 8.0* 8.2* 8.1* 8.2*   Liver Function Tests: Recent Labs  Lab 08/28/2019 1655 08/11/19 0517 08/12/19 0338 08/13/19 0437 08/14/19 0542  AST 55* 43* 41 78* 42*  ALT 34 31 30 63* 49*  ALKPHOS 75 67 64 67 70  BILITOT 0.8 0.7 0.6 0.7 0.8  PROT 6.8 5.8* 5.7* 5.9* 5.8*  ALBUMIN 3.0* 2.5* 2.3* 2.5* 2.4*   Recent Labs  Lab August 17, 2019 1424  LIPASE 24   CBC: Recent Labs  Lab Aug 17, 2019 1424 08/11/19 0517 08/12/19 0338 08/13/19 0437 08/14/19 0542  WBC 4.9 3.2* 7.3 8.9 7.9  NEUTROABS  --  2.4 6.1 7.6 6.6  HGB 14.9 13.4 13.7 14.2 13.9  HCT 45.3 40.0 41.7 42.8 41.6  MCV 90.2 89.1 89.5 89.0 89.3  PLT 248 200 232 264 252     Recent Results (from the past 240 hour(s))  Culture, blood (Routine X 2) w Reflex to ID Panel     Status: None (Preliminary result)   Collection Time: 08-28-2019  9:58 PM   Specimen: BLOOD  Result Value Ref Range Status   Specimen Description BLOOD LEFT FOREARM  Final   Special Requests   Final    BOTTLES DRAWN AEROBIC AND ANAEROBIC Blood Culture adequate volume   Culture   Final    NO GROWTH 4 DAYS Performed at [facility], [address], [place], [place] [address]    Report Status PENDING  Incomplete  Culture, blood (Routine X 2) w Reflex to ID  Panel     Status: None (Preliminary result)   Collection Time: 2019-08-28  9:58 PM   Specimen: BLOOD  Result Value Ref Range Status   Specimen Description BLOOD LEFT ASSIST CONTROL  Final   Special Requests   Final    BOTTLES DRAWN AEROBIC AND ANAEROBIC Blood Culture results may not be optimal due to an excessive volume of blood received in culture bottles   Culture   Final    NO GROWTH 4 DAYS Performed at [facility], [address], [place], [place] [address]    Report Status PENDING  Incomplete  Urine Culture     Status: Abnormal   Collection Time: 08/11/19  9:33 AM   Specimen: Urine, Clean Catch  Result Value Ref Range Status   Specimen Description   Final    URINE, CLEAN CATCH Performed at [facility], [address] [place], [place] [address]    Special Requests   Final    Normal Performed at [facility], [address] [place], [place] [address]    Culture >=100,000 COLONIES/mL ENTEROCOCCUS FAECALIS (A)  Final   Report Status 08/13/2019 FINAL  Final   Organism ID, Bacteria ENTEROCOCCUS FAECALIS (A)  Final      Susceptibility   Enterococcus faecalis - MIC*    AMPICILLIN <=2 SENSITIVE Sensitive     NITROFURANTOIN <=16 SENSITIVE Sensitive     VANCOMYCIN 1 SENSITIVE Sensitive     * >=100,000 COLONIES/mL ENTEROCOCCUS FAECALIS  MRSA PCR Screening     Status: None   Collection Time: 08/11/19  3:25 PM   Specimen: Nasal Mucosa; Nasopharyngeal  Result Value Ref Range Status   MRSA by PCR NEGATIVE NEGATIVE Final    Comment:        The GeneXpert MRSA Assay (FDA approved for NASAL specimens only), is one component of a comprehensive MRSA colonization surveillance program. It is not intended to diagnose MRSA infection nor to guide or monitor treatment for MRSA infections. Performed at [facility], [address] [place], [place] [address]      Studies: [facility] Chest [place] 1 View  Result Date: 08/12/2019 CLINICAL DATA:  COVID  positive, left rib pain since fall 2 months prior EXAM: PORTABLE CHEST 1 VIEW COMPARISON:  2019-08-28 chest radiograph. FINDINGS: Right rotated chest radiograph. Stable cardiomediastinal silhouette with normal heart size. No pneumothorax. No pleural effusion. Hazy patchy opacities in the lower lungs bilaterally are unchanged. IMPRESSION: Stable hazy patchy bilateral lower lung opacities compatible with pneumonia. Electronically Signed   By: Delbert Phenix M.D.   On: 08/12/2019 15:50   [place] EKG SITE RITE  Result Date: 08/13/2019  If [employer] not attached, placement could not be confirmed due to current cardiac rhythm.   Scheduled Meds: . amoxicillin-clavulanate  1 tablet Oral Q12H  . vitamin C  500 mg Oral BID  . aspirin EC  81 mg Oral Daily  . Chlorhexidine Gluconate Cloth  6 each Topical Daily  . cholecalciferol  2,000 Units Oral Daily  . colchicine  0.6 mg Oral BID  . dexamethasone (DECADRON) injection  6 mg Intravenous q1800  . enoxaparin (LOVENOX) injection  40 mg Subcutaneous Q24H  . famotidine  40 mg Oral Daily  . guaiFENesin  600 mg Oral BID  . insulin aspart  0-15 Units Subcutaneous Q4H  . latanoprost  1 drop Both Eyes QHS  . multivitamin with minerals  1 tablet Oral Daily  . tamsulosin  0.4 mg Oral QPC supper  . timolol  1 drop Both Eyes Daily  . zinc sulfate  220 mg Oral Daily   Continuous Infusions: . doxycycline (VIBRAMYCIN) IV 100 mg (08/14/19 1309)    Assessment/Plan:  1. Acute hypoxic respiratory failure secondary to COVID-19 pneumonia.  The patient is on 86% FiO2 with high flow nasal cannula.  Last dose of remdesivir already given today.  Continue Decadron, vitamin C and zinc.  Critical care specialist gave ivermectin.  Patient empirically on Augmentin and doxycycline.  Gave 1 dose of Lasix to see if that improves oxygenation 2. Left rib pain likely pleuritic in nature since this has gone away.  X-ray negative for fracture 3. Recent diagnosis of pericarditis,  restarted colchicine 4. UTI with Enterococcus.  Patient on Augmentin 5. GERD on Pepcid 6. Glaucoma unspecified on eyedrops 7. BPH on Flomax 8. Weakness.  Physical therapy evaluation for tomorrow  Code Status:     Code Status Orders  (From admission, onward)         Start     Ordered   08/15/2019 2054  Full code  Continuous     08/25/2019 2054        Code Status History    Date Active Date Inactive Code Status Order ID Comments User Context   09/02/2019 1919 08/22/2019 2054 DNR [id number]  Hannah Beat, MD ED   05/22/2019 1413 05/24/2019 1746 DNR [id number]  Auburn Bilberry, MD Inpatient   05/22/2019 1338 05/22/2019 1413 Full Code [id number]  Auburn Bilberry, MD Inpatient   05/06/2016 1525 05/08/2016 1226 Full Code [id number]  Alford Highland, MD ED   Advance Care Planning Activity     Family Communication: Left message for wife Disposition Plan: To be determined.  Consultants:  Critical care specialist  Antibiotics:  Augmentin  Doxycycline  Time spent: 26 minutes  [employer]

## 2019-08-14 NOTE — Progress Notes (Signed)
CRITICAL CARE NOTE  BRIEF PATIENT DESCRIPTION:  86 year old male admitted 09/05/2019 with acute hypoxic respiratory failure in the setting of COVID-19 pneumonia.  Given his increasing FiO2 requirements on 1/6, he was admitted to stepdown unit with PCCM consultation for potential intubation.  SIGNIFICANT EVENTS:  1/5-presented to [facility] ED 1/6-admission status changed to stepdown due to increasing FiO2 requirements, PCCM consulted 1/7-FiO2 requirements decreasing 1/8 remains hypoxic  STUDIES:  12/30 CTA chest>>1. No CT evidence of pulmonary embolism. 2. Small bilateral pleural effusions, new since the prior CT. 3. Minimal apparent thickening of the pericardium with induration of the pericardial fat. Clinical correlation is recommended to evaluate for pericarditis. 4. Aortic Atherosclerosis  1/5- chest x-ray>>Bilateral pulmonary infiltrates, left side greater than right. Findings most consistent with pneumonia. 1/6-bilateral carotid ultrasound>>Very minimal amount of bilateral atherosclerotic plaque and intimal wall thickening, right greater than left, not resulting in a hemodynamically significant stenosis within either internal carotid Artery. 1/6- 2D Echocardiogram>>Left ventricular ejection fraction, by visual estimation, is 50 to 55%. The left ventricle has normal function. There is no left ventricular hypertrophy. Unable to exclude regional wall motion abnormality.   CULTURES: SARS-CoV-2 antigen 1/5>> POSITIVE Blood culture x2 1/5>> Urine 1/6>> MRSA PCR 1/6>> negative Strep pneumo urinary antigen 1/6>>Negative Legionella urinary antigens 1/6>>  ANTIBIOTICS: Azithromycin 1/5>>1/5 Ceftriaxone 1/6>> Doxycycline 1/6>> Remdesivir 1/5>>    CC  follow up respiratory failure  SUBJECTIVE Severe hypoxia Prognosis is guarded COVID infection/pneumonia  BP 109/65   Pulse 71   Temp 97.9 F (36.6 C) (Axillary)   Resp (!) 24   Ht 5\' 6"  (1.676 m)   Wt 72.6 kg   SpO2 (!) 83%    BMI 25.82 kg/m    I/O last 3 completed shifts: In: 1550 [P.O.:680; IV Piggyback:870] Out: 1950 [Urine:1950] Total I/O In: 160 [P.O.:60; IV Piggyback:100] Out: 450 [Urine:450]  SpO2: (!) 83 % O2 Flow Rate (L/min): 50 L/min FiO2 (%): 70 %   COVID-19 DISASTER DECLARATION:   FULL CONTACT PHYSICAL EXAMINATION WAS NOT POSSIBLE DUE TO TREATMENT OF COVID-19 AND   CONSERVATION OF PERSONAL PROTECTIVE EQUIPMENT, LIMITED EXAM FINDINGS INCLUDE-   Patient assessed or the symptoms described in the history of present illness.   In the context of the Global COVID-19 pandemic, which necessitated consideration that the patient might be at risk for infection with the SARS-CoV-2 virus that causes COVID-19, Institutional protocols and algorithms that pertain to the evaluation of patients at risk for COVID-19 are in a state of rapid change based on information released by regulatory bodies including the CDC and federal and state organizations. These policies and algorithms were followed during the patient's care while in hospital.     MEDICATIONS: I have reviewed all medications and confirmed regimen as documented   CULTURE RESULTS   Recent Results (from the past 240 hour(s))  Culture, blood (Routine X 2) w Reflex to ID Panel     Status: None (Preliminary result)   Collection Time: 09/04/2019  9:58 PM   Specimen: BLOOD  Result Value Ref Range Status   Specimen Description BLOOD LEFT FOREARM  Final   Special Requests   Final    BOTTLES DRAWN AEROBIC AND ANAEROBIC Blood Culture adequate volume   Culture   Final    NO GROWTH 3 DAYS Performed at [facility], [address], [place], [place]    Report Status PENDING  Incomplete  Culture, blood (Routine X 2) w Reflex to ID Panel     Status: None (Preliminary result)  Collection Time: 08/27/19  9:58 PM   Specimen: BLOOD  Result Value Ref Range Status   Specimen Description BLOOD LEFT ASSIST CONTROL  Final   Special Requests    Final    BOTTLES DRAWN AEROBIC AND ANAEROBIC Blood Culture results may not be optimal due to an excessive volume of blood received in culture bottles   Culture   Final    NO GROWTH 3 DAYS Performed at [facility], [address], [place], [place] [address]    Report Status PENDING  Incomplete  Urine Culture     Status: Abnormal   Collection Time: 08/11/19  9:33 AM   Specimen: Urine, Clean Catch  Result Value Ref Range Status   Specimen Description   Final    URINE, CLEAN CATCH Performed at [facility], [address] [place], [place] [address]    Special Requests   Final    Normal Performed at [facility], [address] [place], [place] [address]    Culture >=100,000 COLONIES/mL ENTEROCOCCUS FAECALIS (A)  Final   Report Status 08/13/2019 FINAL  Final   Organism ID, Bacteria ENTEROCOCCUS FAECALIS (A)  Final      Susceptibility   Enterococcus faecalis - MIC*    AMPICILLIN <=2 SENSITIVE Sensitive     NITROFURANTOIN <=16 SENSITIVE Sensitive     VANCOMYCIN 1 SENSITIVE Sensitive     * >=100,000 COLONIES/mL ENTEROCOCCUS FAECALIS  MRSA PCR Screening     Status: None   Collection Time: 08/11/19  3:25 PM   Specimen: Nasal Mucosa; Nasopharyngeal  Result Value Ref Range Status   MRSA by PCR NEGATIVE NEGATIVE Final    Comment:        The GeneXpert MRSA Assay (FDA approved for NASAL specimens only), is one component of a comprehensive MRSA colonization surveillance program. It is not intended to diagnose MRSA infection nor to guide or monitor treatment for MRSA infections. Performed at [facility], [address] [place], [place] [address]           IMAGING    [place] EKG SITE RITE  Result Date: 08/13/2019 If [facility] image not attached, placement could not be confirmed due to current cardiac rhythm.      ASSESSMENT AND PLAN SYNOPSIS   Severe ACUTE Hypoxic Respiratory Failure/ARDS/COVID 19 infection and  pneumonia   Severe COVID-19 infection, ARDS and pneumonia/pneumonitis Continue IV steroids  IV remdisivir Aggressive pulm toilet recommended OOB to chair as tolerated  Pulmonary hygiene Continue proning as tolerated due to severe hypoxia   Maintain airborne and contact precautions  As needed bronchodilators (MDI) Vitamin C and zinc Antitussives High risk for intubation and death  CARDIAC ICU monitoring  ID -continue IV abx as prescibed -follow up cultures  GI GI PROPHYLAXIS as indicated  NUTRITIONAL STATUS DIET-->TF's as tolerated Constipation protocol as indicated  ENDO - will use ICU hypoglycemic\Hyperglycemia protocol if indicated   ELECTROLYTES -follow labs as needed -replace as needed -pharmacy consultation and following   DVT/GI PRX ordered TRANSFUSIONS AS NEEDED MONITOR FSBS ASSESS the need for LABS as needed     Corrin Parker, M.D.  [facility]  Medical Director [place] Director [place] Department

## 2019-08-14 NOTE — Progress Notes (Signed)
Patient ID: Bill Cooper, male   DOB: October 18, 1929, 86 y.o.   MRN: [id number]  Spoke with Margaretha Glassing the patient's stepdaughter at [phone].  The patient's wife is also sick with Covid and she is being admitted to the hospital.  Dr Alford Highland

## 2019-08-14 NOTE — Progress Notes (Signed)
Spoke with Dr Renae Gloss and RN and secure chat with Dr Belia Heman re PICC Order.  Pt has adequate PIV access for IV needs at this time.  PICC order cancelled.

## 2019-08-15 DIAGNOSIS — R410 Disorientation, unspecified: Secondary | ICD-10-CM

## 2019-08-15 DIAGNOSIS — B952 Enterococcus as the cause of diseases classified elsewhere: Secondary | ICD-10-CM

## 2019-08-15 DIAGNOSIS — N390 Urinary tract infection, site not specified: Secondary | ICD-10-CM

## 2019-08-15 LAB — CBC WITH DIFFERENTIAL/PLATELET
Abs Immature Granulocytes: 0.36 10*3/uL — ABNORMAL HIGH (ref 0.00–0.07)
Basophils Absolute: 0 10*3/uL (ref 0.0–0.1)
Basophils Relative: 0 %
Eosinophils Absolute: 0 10*3/uL (ref 0.0–0.5)
Eosinophils Relative: 0 %
HCT: 42.1 % (ref 39.0–52.0)
Hemoglobin: 13.9 g/dL (ref 13.0–17.0)
Immature Granulocytes: 3 %
Lymphocytes Relative: 7 %
Lymphs Abs: 0.9 10*3/uL (ref 0.7–4.0)
MCH: 29.4 pg (ref 26.0–34.0)
MCHC: 33 g/dL (ref 30.0–36.0)
MCV: 89.2 fL (ref 80.0–100.0)
Monocytes Absolute: 0.5 10*3/uL (ref 0.1–1.0)
Monocytes Relative: 4 %
Neutro Abs: 9.7 10*3/uL — ABNORMAL HIGH (ref 1.7–7.7)
Neutrophils Relative %: 86 %
Platelets: 271 10*3/uL (ref 150–400)
RBC: 4.72 MIL/uL (ref 4.22–5.81)
RDW: 12.7 % (ref 11.5–15.5)
WBC: 11.4 10*3/uL — ABNORMAL HIGH (ref 4.0–10.5)
nRBC: 0 % (ref 0.0–0.2)

## 2019-08-15 LAB — COMPREHENSIVE METABOLIC PANEL
ALT: 40 U/L (ref 0–44)
AST: 33 U/L (ref 15–41)
Albumin: 2.4 g/dL — ABNORMAL LOW (ref 3.5–5.0)
Alkaline Phosphatase: 69 U/L (ref 38–126)
Anion gap: 12 (ref 5–15)
BUN: 28 mg/dL — ABNORMAL HIGH (ref 8–23)
CO2: 23 mmol/L (ref 22–32)
Calcium: 8 mg/dL — ABNORMAL LOW (ref 8.9–10.3)
Chloride: 102 mmol/L (ref 98–111)
Creatinine, Ser: 0.82 mg/dL (ref 0.61–1.24)
GFR calc Af Amer: >60 mL/min (ref >60)
GFR calc non Af Amer: >60 mL/min (ref >60)
Glucose, Bld: 99 mg/dL (ref 70–99)
Potassium: 3.9 mmol/L (ref 3.5–5.1)
Sodium: 137 mmol/L (ref 135–145)
Total Bilirubin: 0.9 mg/dL (ref 0.3–1.2)
Total Protein: 5.6 g/dL — ABNORMAL LOW (ref 6.5–8.1)

## 2019-08-15 LAB — GLUCOSE, CAPILLARY
Glucose-Capillary: 109 mg/dL — ABNORMAL HIGH (ref 70–99)
Glucose-Capillary: 126 mg/dL — ABNORMAL HIGH (ref 70–99)
Glucose-Capillary: 86 mg/dL (ref 70–99)
Glucose-Capillary: 87 mg/dL (ref 70–99)

## 2019-08-15 LAB — CULTURE, BLOOD (ROUTINE X 2)
Culture: NO GROWTH
Culture: NO GROWTH
Special Requests: ADEQUATE

## 2019-08-15 LAB — FERRITIN: Ferritin: 1433 ng/mL — ABNORMAL HIGH (ref 24–336)

## 2019-08-15 LAB — C-REACTIVE PROTEIN: CRP: 9 mg/dL — ABNORMAL HIGH (ref <1.0)

## 2019-08-15 LAB — FIBRIN DERIVATIVES D-DIMER (ARMC ONLY): Fibrin derivatives D-dimer (ARMC): 556.37 ng/mL (FEU) — ABNORMAL HIGH (ref 0.00–499.00)

## 2019-08-15 MED ORDER — FUROSEMIDE 10 MG/ML IJ SOLN
20.0000 mg | Freq: Once | INTRAMUSCULAR | Status: AC
  Administered 2019-08-15: 20 mg via INTRAVENOUS
  Filled 2019-08-15: qty 2

## 2019-08-15 MED ORDER — ACETAMINOPHEN 325 MG PO TABS: 650.0000 mg | ORAL_TABLET | Freq: Four times a day (QID) | ORAL | Status: DC | PRN

## 2019-08-15 MED ORDER — MORPHINE SULFATE (PF) 2 MG/ML IV SOLN
2.0000 mg | INTRAVENOUS | Status: DC | PRN
  Administered 2019-08-15 (×2): 2 mg via INTRAVENOUS
  Filled 2019-08-15 (×2): qty 1

## 2019-08-15 MED ORDER — CHLORHEXIDINE GLUCONATE CLOTH 2 % EX PADS: 6.0000 | MEDICATED_PAD | Freq: Every day | CUTANEOUS | Status: DC

## 2019-08-15 MED ORDER — MIDAZOLAM HCL 2 MG/2ML IJ SOLN: 2.0000 mg | INTRAMUSCULAR | Status: DC | PRN

## 2019-08-15 MED ORDER — MORPHINE 100MG IN NS 100ML (1MG/ML) PREMIX INFUSION
0.0000 mg/h | INTRAVENOUS | Status: DC
  Administered 2019-08-15: 5 mg/h via INTRAVENOUS
  Administered 2019-08-16 (×2): 8 mg/h via INTRAVENOUS
  Filled 2019-08-15 (×6): qty 100

## 2019-08-15 MED ORDER — DEXTROSE 5 % IV SOLN: INTRAVENOUS | Status: DC

## 2019-08-15 MED ORDER — MORPHINE BOLUS VIA INFUSION
5.0000 mg | INTRAVENOUS | Status: DC | PRN
  Administered 2019-08-15 – 2019-08-17 (×2): 5 mg via INTRAVENOUS
  Filled 2019-08-15: qty 5

## 2019-08-15 MED ORDER — GLYCOPYRROLATE 0.2 MG/ML IJ SOLN: 0.2000 mg | INTRAMUSCULAR | Status: DC | PRN

## 2019-08-15 MED ORDER — DEXMEDETOMIDINE HCL IN NACL 400 MCG/100ML IV SOLN
0.4000 ug/kg/h | INTRAVENOUS | Status: DC
  Administered 2019-08-15: 0.6 ug/kg/h via INTRAVENOUS
  Filled 2019-08-15: qty 100

## 2019-08-15 MED ORDER — ACETAMINOPHEN 650 MG RE SUPP: 650.0000 mg | Freq: Four times a day (QID) | RECTAL | Status: DC | PRN

## 2019-08-15 MED ORDER — MORPHINE SULFATE (PF) 2 MG/ML IV SOLN: 2.0000 mg | INTRAVENOUS | Status: DC | PRN

## 2019-08-15 MED ORDER — POLYVINYL ALCOHOL 1.4 % OP SOLN
1.0000 [drp] | Freq: Four times a day (QID) | OPHTHALMIC | Status: DC | PRN
  Filled 2019-08-15: qty 15

## 2019-08-15 MED ORDER — GLYCOPYRROLATE 1 MG PO TABS
1.0000 mg | ORAL_TABLET | ORAL | Status: DC | PRN
  Filled 2019-08-15: qty 1

## 2019-08-15 MED ORDER — DIPHENHYDRAMINE HCL 50 MG/ML IJ SOLN: 25.0000 mg | INTRAMUSCULAR | Status: DC | PRN

## 2019-08-15 NOTE — Progress Notes (Signed)
Patient ID: Bill Cooper, male   DOB: 1929/10/11, 86 y.o.   MRN: [id number] [facility] PROGRESS NOTE  Bill Cooper NLZ:[id number] DOB: [birth year] DOA: Sep 01, 2019 PCP: Adin Hector, MD  HPI/Subjective: Patient did not sleep last night.  He is more confused than he normally is.  Normally he is pretty sharp with his answers and questions.  Patient complains of sore throat.  Objective: Vitals:   08/15/19 0800 08/15/19 0819  BP: (!) 101/56   Pulse: 94   Resp: (!) 28   Temp:    SpO2: (!) 85% (!) 87%    Intake/Output Summary (Last 24 hours) at 08/15/2019 1128 Last data filed at 08/15/2019 0500 Gross per 24 hour  Intake 550.72 ml  Output 1550 ml  Net -999.28 ml   Filed Weights   2019-09-01 1415  Weight: 72.6 kg    ROS: Review of Systems  Unable to perform ROS: Acuity of condition  HENT: Positive for sore throat.   Respiratory: Positive for cough and shortness of breath.   Cardiovascular: Negative for chest pain.  Gastrointestinal: Negative for abdominal pain.   Exam: Physical Exam  HENT:  Nose: No mucosal edema.  Mouth/Throat: No oropharyngeal exudate or posterior oropharyngeal edema.  Eyes: Pupils are equal, round, and reactive to light. Conjunctivae, EOM and lids are normal.  Neck: Carotid bruit is not present.  Cardiovascular: S1 normal and S2 normal. Exam reveals no gallop.  No murmur heard. Pulses:      Dorsalis pedis pulses are 2+ on the right side and 2+ on the left side.  Respiratory: He has decreased breath sounds in the right middle field, the right lower field, the left middle field and the left lower field. He has no wheezes. He has no rhonchi. He has no rales.  GI: Soft. Bowel sounds are normal. There is no abdominal tenderness.  Musculoskeletal:     Right ankle: No swelling.     Left ankle: No swelling.  Lymphadenopathy:    He has no cervical adenopathy.  Neurological: He is alert.  Patient able to straight leg raise.  Skin: Skin is warm. No  rash noted. Nails show no clubbing.  Psychiatric:  Patient more confused today than previously.      Data Reviewed: Basic Metabolic Panel: Recent Labs  Lab 08/11/19 0517 08/12/19 0338 08/13/19 0437 08/14/19 0542 08/15/19 0515  NA 136 137 136 137 137  K 4.1 4.4 4.1 4.4 3.9  CL 103 104 103 103 102  CO2 23 23 21* 23 23  GLUCOSE 172* 146* 117* 131* 99  BUN 18 22 22  24* 28*  CREATININE 0.63 0.53* 0.63 0.68 0.82  CALCIUM 8.0* 8.2* 8.1* 8.2* 8.0*   Liver Function Tests: Recent Labs  Lab 08/11/19 0517 08/12/19 0338 08/13/19 0437 08/14/19 0542 08/15/19 0515  AST 43* 41 78* 42* 33  ALT 31 30 63* 49* 40  ALKPHOS 67 64 67 70 69  BILITOT 0.7 0.6 0.7 0.8 0.9  PROT 5.8* 5.7* 5.9* 5.8* 5.6*  ALBUMIN 2.5* 2.3* 2.5* 2.4* 2.4*   Recent Labs  Lab 09/01/19 1424  LIPASE 24   CBC: Recent Labs  Lab 08/11/19 0517 08/12/19 0338 08/13/19 0437 08/14/19 0542 08/15/19 0515  WBC 3.2* 7.3 8.9 7.9 11.4*  NEUTROABS 2.4 6.1 7.6 6.6 9.7*  HGB 13.4 13.7 14.2 13.9 13.9  HCT 40.0 41.7 42.8 41.6 42.1  MCV 89.1 89.5 89.0 89.3 89.2  PLT 200 232 264 252 271     Recent Results (from  the past 240 hour(s))  Culture, blood (Routine X 2) w Reflex to ID Panel     Status: None   Collection Time: 2019/08/21  9:58 PM   Specimen: BLOOD  Result Value Ref Range Status   Specimen Description BLOOD LEFT FOREARM  Final   Special Requests   Final    BOTTLES DRAWN AEROBIC AND ANAEROBIC Blood Culture adequate volume   Culture   Final    NO GROWTH 5 DAYS Performed at [facility], [address] [place], [place] [address]    Report Status 08/15/2019 FINAL  Final  Culture, blood (Routine X 2) w Reflex to ID Panel     Status: None   Collection Time: 08/21/19  9:58 PM   Specimen: BLOOD  Result Value Ref Range Status   Specimen Description BLOOD LEFT ASSIST CONTROL  Final   Special Requests   Final    BOTTLES DRAWN AEROBIC AND ANAEROBIC Blood Culture results may not be optimal due to an  excessive volume of blood received in culture bottles   Culture   Final    NO GROWTH 5 DAYS Performed at [facility], [address] [place], [place] [address]    Report Status 08/15/2019 FINAL  Final  Urine Culture     Status: Abnormal   Collection Time: 08/11/19  9:33 AM   Specimen: Urine, Clean Catch  Result Value Ref Range Status   Specimen Description   Final    URINE, CLEAN CATCH Performed at [facility], [address] [place], [place] [address]    Special Requests   Final    Normal Performed at [facility], [address] [place], [place] [address]    Culture >=100,000 COLONIES/mL ENTEROCOCCUS FAECALIS (A)  Final   Report Status 08/13/2019 FINAL  Final   Organism ID, Bacteria ENTEROCOCCUS FAECALIS (A)  Final      Susceptibility   Enterococcus faecalis - MIC*    AMPICILLIN <=2 SENSITIVE Sensitive     NITROFURANTOIN <=16 SENSITIVE Sensitive     VANCOMYCIN 1 SENSITIVE Sensitive     * >=100,000 COLONIES/mL ENTEROCOCCUS FAECALIS  MRSA PCR Screening     Status: None   Collection Time: 08/11/19  3:25 PM   Specimen: Nasal Mucosa; Nasopharyngeal  Result Value Ref Range Status   MRSA by PCR NEGATIVE NEGATIVE Final    Comment:        The GeneXpert MRSA Assay (FDA approved for NASAL specimens only), is one component of a comprehensive MRSA colonization surveillance program. It is not intended to diagnose MRSA infection nor to guide or monitor treatment for MRSA infections. Performed at [facility], [address] [place], [place] [address]      Studies: [place] EKG SITE RITE  Result Date: 08/13/2019 If [facility] image not attached, placement could not be confirmed due to current cardiac rhythm.   Scheduled Meds: . amoxicillin-clavulanate  1 tablet Oral Q12H  . vitamin C  500 mg Oral BID  . aspirin EC  81 mg Oral Daily  . Chlorhexidine Gluconate Cloth  6 each Topical Daily  . cholecalciferol  2,000 Units Oral Daily  .  colchicine  0.6 mg Oral BID  . dexamethasone (DECADRON) injection  6 mg Intravenous q1800  . enoxaparin (LOVENOX) injection  40 mg Subcutaneous Q24H  . famotidine  40 mg Oral Daily  . furosemide  20 mg Intravenous Once  . guaiFENesin  600 mg Oral BID  . insulin aspart  0-15 Units  Subcutaneous Q4H  . latanoprost  1 drop Both Eyes QHS  . multivitamin with minerals  1 tablet Oral Daily  . tamsulosin  0.4 mg Oral QPC supper  . timolol  1 drop Both Eyes Daily  . zinc sulfate  220 mg Oral Daily   Continuous Infusions: . doxycycline (VIBRAMYCIN) IV Stopped (08/15/19 0420)    Assessment/Plan:  1. Acute hypoxic respiratory failure secondary to COVID-19 pneumonia.  The patient is on 87% FiO2 with high flow nasal cannula.  Patient completed remdesivir course..  Continue Decadron, vitamin C and zinc.  Critical care specialist gave ivermectin.  Patient empirically on Augmentin and doxycycline.  Critical care specialist ordered another dose of Lasix.  Patient high risk of intubation. 2. Acute delirium.  Case discussed with critical care specialist and he may consider Precedex drip versus Seroquel at night in order to get him sleeping. 3. Recent diagnosis of pericarditis, restarted colchicine 4. UTI with Enterococcus.  Patient on Augmentin 5. GERD on Pepcid 6. Glaucoma unspecified on eyedrops 7. BPH on Flomax 8. Weakness.   Code Status:     Code Status Orders  (From admission, onward)         Start     Ordered   09/03/2019 2054  Full code  Continuous     08/29/2019 2054        Code Status History    Date Active Date Inactive Code Status Order ID Comments User Context   08/07/2019 1919 08/16/2019 2054 DNR [id number]  Hannah Beat, MD ED   05/22/2019 1413 05/24/2019 1746 DNR [id number]  Auburn Bilberry, MD Inpatient   05/22/2019 1338 05/22/2019 1413 Full Code [id number]  Auburn Bilberry, MD Inpatient   05/06/2016 1525 05/08/2016 1226 Full Code [id number]  Alford Highland, MD ED   Advance Care  Planning Activity     Family Communication: I spoke with the patient's wife and stepdaughter.  She gave me numbers for the patient's daughters.  Cordelia Pen [phone].  Gunnar Fusi [phone]. Disposition Plan: To be determined.  Consultants:  Critical care specialist  Antibiotics:  Augmentin  Doxycycline  Time spent: 28 minutes, case discussed with critical care specialist.  He will take over care at this point since the patient has not been getting better and most of his symptoms are pulmonary.  I will sign off and transfer service to Dr Belia Heman.  Aldean Suddeth [employer]  [facility] [employer]

## 2019-08-15 NOTE — Progress Notes (Signed)
PT Cancellation Note  Patient Details Name: Bill Cooper MRN: [id number] DOB: April 03, 1930   Cancelled Treatment:    Reason Eval/Treat Not Completed: Other (comment). Pt currently comfort care. Will dc current orders at this time.   Jayani Rozman 08/15/2019, 2:43 PM Elizabeth Palau, PT, DPT [phone]

## 2019-08-15 NOTE — Progress Notes (Signed)
Patient ID: Bill Cooper, male   DOB: 1929/12/10, 86 y.o.   MRN: [id number]  Critical care specialist made comfort care and speaking with the patient's wife and daughters.  I will continue rounding on the patient and the critical care specialist will sign off.  Dr Alford Highland

## 2019-08-15 NOTE — Progress Notes (Signed)
   08/15/19 1511  Clinical Encounter Type  Visited With Patient and family together  Visit Type Initial  Referral From Nurse  Consult/Referral To Chaplain  Spiritual Encounters  Spiritual Needs Emotional  CH received page at 1511 for patient and family who needed emotional and spiritual support. Pt's loved one was with RN Martie Lee upon arrival. Loved one desired to be bedside with patient and was assisted in putting on PPE equipment. This author and CH Waters remained outside in hallway throughout visit. Loved one left room and was assisted by RN in taking off PPE equipment. Loved one was happy to spend time with pt. Pt's family members were able to visit virtually. This Chartered loss adjuster and CH Waters provided pastoral care through [employer] of presence. Pt's wife is a patient in hospital as well. CH will visit spouse via telephone. Pastoral visit was appreciated.

## 2019-08-15 NOTE — Progress Notes (Signed)
Family At bedside, clinical status relayed to family(wife at bedside-daughter [place] in [place])  Updated and notified of patients medical condition-patient is dying of COVID 19 infection/pneumonia  Progressive multiorgan failure with very low chance of meaningful recovery.  Patient is in dying  Process.  Family understands the situation.  They have consented and agreed to DNR/DNI and would like to proceed with Comfort care measures.   Family are satisfied with Plan of action and management. All questions answered  Lucie Leather, M.D.  [facility]  Medical Director [facility] Medical Director [facility] Cardio-Pulmonary Department

## 2019-08-15 NOTE — Progress Notes (Signed)
Assisted tele visit to patient with family member.  Jheremy Boger P, RN  

## 2019-08-15 NOTE — Progress Notes (Signed)
CRITICAL CARE NOTE  BRIEF PATIENT DESCRIPTION:  86 year old male admitted 08/06/2019 with acute hypoxic respiratory failure in the setting of COVID-19 pneumonia.  Given his increasing FiO2 requirements on 1/6, he was admitted to stepdown unit with PCCM consultation for potential intubation.  SIGNIFICANT EVENTS:  1/5-presented to [facility] ED 1/6-admission status changed to stepdown due to increasing FiO2 requirements, PCCM consulted 1/7-FiO2 requirements decreasing 1/8 remains hypoxic  STUDIES:  12/30 CTA chest>>1. No CT evidence of pulmonary embolism. 2. Small bilateral pleural effusions, new since the prior CT. 3. Minimal apparent thickening of the pericardium with induration of the pericardial fat. Clinical correlation is recommended to evaluate for pericarditis. 4. Aortic Atherosclerosis  1/5- chest x-ray>>Bilateral pulmonary infiltrates, left side greater than right. Findings most consistent with pneumonia. 1/6-bilateral carotid ultrasound>>Very minimal amount of bilateral atherosclerotic plaque and intimal wall thickening, right greater than left, not resulting in a hemodynamically significant stenosis within either internal carotid Artery. 1/6- 2D Echocardiogram>>Left ventricular ejection fraction, by visual estimation, is 50 to 55%. The left ventricle has normal function. There is no left ventricular hypertrophy. Unable to exclude regional wall motion abnormality.   CULTURES: SARS-CoV-2 antigen 1/5>> POSITIVE Blood culture x2 1/5>> Urine 1/6>> MRSA PCR 1/6>> negative Strep pneumo urinary antigen 1/6>>Negative Legionella urinary antigens 1/6>>  ANTIBIOTICS: Azithromycin 1/5>>1/5 Ceftriaxone 1/6>> Doxycycline 1/6>> Remdesivir 1/5>>    CC  Severe hypoxia  SUBJECTIVE Increased WOB Increased delirium +COVID 19 infection  BP (!) 101/56   Pulse 94   Temp 98.2 F (36.8 C) (Oral)   Resp (!) 28   Ht 5\' 6"  (1.676 m)   Wt 72.6 kg   SpO2 (!) 87%   BMI 25.82 kg/m     I/O last 3 completed shifts: In: 1020.7 [P.O.:120; IV Piggyback:900.7] Out: 2000 [Urine:2000] No intake/output data recorded.  SpO2: (!) 87 % O2 Flow Rate (L/min): 50 L/min FiO2 (%): 86 %   COVID-19 DISASTER DECLARATION:   FULL CONTACT PHYSICAL EXAMINATION WAS NOT POSSIBLE DUE TO TREATMENT OF COVID-19 AND   CONSERVATION OF PERSONAL PROTECTIVE EQUIPMENT, LIMITED EXAM FINDINGS INCLUDE-   Patient assessed or the symptoms described in the history of present illness.   In the context of the Global COVID-19 pandemic, which necessitated consideration that the patient might be at risk for infection with the SARS-CoV-2 virus that causes COVID-19, Institutional protocols and algorithms that pertain to the evaluation of patients at risk for COVID-19 are in a state of rapid change based on information released by regulatory bodies including the CDC and federal and state organizations. These policies and algorithms were followed during the patient's care while in hospital.   Active Ambulatory Problems    Diagnosis Date Noted  . Sepsis (HCC) 05/06/2016  . Arthritis 11/18/2016  . Benign prostatic hyperplasia 01/25/2013  . Chest pain with high risk for cardiac etiology 10/18/2016  . History of DVT (deep vein thrombosis) 10/11/2015  . Peripheral tear of medial meniscus as current injury 03/15/2014  . 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 Bilateral lower abdominal pain 07/16/2018  . Change in bowel function 07/16/2018  . Nocturia 03/16/2019  . Syncope and collapse 05/22/2019  . Avascular necrosis of bone of hip, right (HCC) 05/30/2019  . Gastroesophageal reflux disease without esophagitis   . Glaucoma    Resolved Ambulatory Problems    Diagnosis Date Noted  . No Resolved Ambulatory Problems   Past Medical History:  Diagnosis Date  . DVT (deep venous thrombosis) (HCC) 2012  . GERD (gastroesophageal reflux disease)   .  Wears dentures   . Wears hearing aid      MEDICATIONS: I  have reviewed all medications and confirmed regimen as documented   CULTURE RESULTS   Recent Results (from the past 240 hour(s))  Culture, blood (Routine X 2) w Reflex to ID Panel     Status: None   Collection Time: 08/16/2019  9:58 PM   Specimen: BLOOD  Result Value Ref Range Status   Specimen Description BLOOD LEFT FOREARM  Final   Special Requests   Final    BOTTLES DRAWN AEROBIC AND ANAEROBIC Blood Culture adequate volume   Culture   Final    NO GROWTH 5 DAYS Performed at [facility], [address] [place], [place] [address]    Report Status 08/15/2019 FINAL  Final  Culture, blood (Routine X 2) w Reflex to ID Panel     Status: None   Collection Time: 08/06/2019  9:58 PM   Specimen: BLOOD  Result Value Ref Range Status   Specimen Description BLOOD LEFT ASSIST CONTROL  Final   Special Requests   Final    BOTTLES DRAWN AEROBIC AND ANAEROBIC Blood Culture results may not be optimal due to an excessive volume of blood received in culture bottles   Culture   Final    NO GROWTH 5 DAYS Performed at [facility], [address] [place], [place] [address]    Report Status 08/15/2019 FINAL  Final  Urine Culture     Status: Abnormal   Collection Time: 08/11/19  9:33 AM   Specimen: Urine, Clean Catch  Result Value Ref Range Status   Specimen Description   Final    URINE, CLEAN CATCH Performed at [facility], [address], [place], [place] [address]    Special Requests   Final    Normal Performed at [facility], [address] [place], [place] [address]    Culture >=100,000 COLONIES/mL ENTEROCOCCUS FAECALIS (A)  Final   Report Status 08/13/2019 FINAL  Final   Organism ID, Bacteria ENTEROCOCCUS FAECALIS (A)  Final      Susceptibility   Enterococcus faecalis - MIC*    AMPICILLIN <=2 SENSITIVE Sensitive     NITROFURANTOIN <=16 SENSITIVE Sensitive     VANCOMYCIN 1 SENSITIVE Sensitive     * >=100,000 COLONIES/mL ENTEROCOCCUS FAECALIS   MRSA PCR Screening     Status: None   Collection Time: 08/11/19  3:25 PM   Specimen: Nasal Mucosa; Nasopharyngeal  Result Value Ref Range Status   MRSA by PCR NEGATIVE NEGATIVE Final    Comment:        The GeneXpert MRSA Assay (FDA approved for NASAL specimens only), is one component of a comprehensive MRSA colonization surveillance program. It is not intended to diagnose MRSA infection nor to guide or monitor treatment for MRSA infections. Performed at [facility], [address] [place], [place] [address]         ASSESSMENT AND PLAN SYNOPSIS  Severe COVID-19 infection, ARDS and pneumonia/pneumonitis Continue IV steroids  IV remdisivir Pulmonary hygiene Continue proning as tolerated due to severe hypoxia   Maintain airborne and contact precautions  As needed bronchodilators (MDI) Vitamin C and zinc Antitussives High risk for intubation and death  PROGNOSIS IS VERY POOR  INFECTIOUS DISEASE -continue antibiotics as prescribed -follow up cultures    CARDIAC ICU monitoring   ELECTROLYTES -follow labs as needed -replace as needed -pharmacy consultation and following    NEUROLOGY Severe delirium and will start precedex  DVT/GI PRX ordered TRANSFUSIONS AS NEEDED MONITOR FSBS ASSESS the need for LABS as needed       Critical Care Time devoted to patient care services described in this note is 43 minutes.   Overall, patient is critically ill, prognosis is guarded.  Patient with Multiorgan failure and at high risk for cardiac arrest and death.   RECOMMEND DNR STATUS  Corrin Parker, M.D.  [facility]  Medical Director [place] Director [facility] Cardio-Pulmonary Department

## 2019-08-16 NOTE — Progress Notes (Signed)
Patient is resting comfortably w/o distress. Responds to touch and voice. His O2 sats currently between 65-70%. Daughter requested to video link to patients room with herself and family members. After video link lights turned back down to promote patient rest. Morphine drip continues at 8mg /hour.

## 2019-08-16 NOTE — Progress Notes (Signed)
Patient was cleaned in bed with CHG wipes and dressed with new gown.  His daughter in law called earlier today and asked for me to relay a message for him. He is saturating in the 60's.  He is still on morphine drip at 8mg .  He does not look in pain.  Full linen change after bath.  He is incontinent of urine.

## 2019-08-16 NOTE — Progress Notes (Signed)
Patient ID: Bill Cooper, male   DOB: Jul 03, 1930, 86 y.o.   MRN: [id number] [facility] PROGRESS NOTE  Bill Cooper [id number] DOB: Aug 19, 1929 DOA: 08/31/19 PCP: Lynnea Ferrier, MD  HPI/Subjective: Patient made comfort care measures yesterday.  Is currently on morphine drip at 8 mg/h.  Pulse ox is in the 70s.  Patient breathing comfortably respirations by me this morning are 14.  Objective: Vitals:   08/16/19 0300 08/16/19 0400  BP:    Pulse: 86 (!) 46  Resp: 19 20  Temp:    SpO2: (!) 71% (!) 76%    Intake/Output Summary (Last 24 hours) at 08/16/2019 0751 Last data filed at 08/15/2019 1600 Gross per 24 hour  Intake 39.36 ml  Output 100 ml  Net -60.64 ml   Filed Weights   August 31, 2019 1415  Weight: 72.6 kg    ROS: Review of Systems  Unable to perform ROS: Critical illness   Exam: Physical Exam  Constitutional: He is sedated.  HENT:  Nose: No mucosal edema.  Unable to look into mouth  Eyes: Conjunctivae and lids are normal.  Neck: Carotid bruit is not present.  Cardiovascular: Regular rhythm, S1 normal, S2 normal and normal heart sounds.  Respiratory: No accessory muscle usage. He has decreased breath sounds in the right lower field and the left lower field. He has no wheezes. He has no rhonchi. He has no rales.  GI: Soft. There is no abdominal tenderness.  Musculoskeletal:     Right ankle: No swelling.     Left ankle: No swelling.  Neurological:  Unresponsive to sternal rub  Skin: Skin is warm.  Psychiatric:  Unresponsive to sternal rub      Data Reviewed: Basic Metabolic Panel: Recent Labs  Lab 08/11/19 0517 08/12/19 0338 08/13/19 0437 08/14/19 0542 08/15/19 0515  NA 136 137 136 137 137  K 4.1 4.4 4.1 4.4 3.9  CL 103 104 103 103 102  CO2 23 23 21* 23 23  GLUCOSE 172* 146* 117* 131* 99  BUN 18 22 22  24* 28*  CREATININE 0.63 0.53* 0.63 0.68 0.82  CALCIUM 8.0* 8.2* 8.1* 8.2* 8.0*   Liver Function Tests: Recent Labs  Lab  08/11/19 0517 08/12/19 0338 08/13/19 0437 08/14/19 0542 08/15/19 0515  AST 43* 41 78* 42* 33  ALT 31 30 63* 49* 40  ALKPHOS 67 64 67 70 69  BILITOT 0.7 0.6 0.7 0.8 0.9  PROT 5.8* 5.7* 5.9* 5.8* 5.6*  ALBUMIN 2.5* 2.3* 2.5* 2.4* 2.4*   Recent Labs  Lab 08-31-19 1424  LIPASE 24   CBC: Recent Labs  Lab 08/11/19 0517 08/12/19 0338 08/13/19 0437 08/14/19 0542 08/15/19 0515  WBC 3.2* 7.3 8.9 7.9 11.4*  NEUTROABS 2.4 6.1 7.6 6.6 9.7*  HGB 13.4 13.7 14.2 13.9 13.9  HCT 40.0 41.7 42.8 41.6 42.1  MCV 89.1 89.5 89.0 89.3 89.2  PLT 200 232 264 252 271    CBG: Recent Labs  Lab 08/14/19 2114 08/15/19 0010 08/15/19 0324 08/15/19 0802 08/15/19 1318  GLUCAP 146* 86 109* 126* 87    Recent Results (from the past 240 hour(s))  Culture, blood (Routine X 2) w Reflex to ID Panel     Status: None   Collection Time: 2019/08/31  9:58 PM   Specimen: BLOOD  Result Value Ref Range Status   Specimen Description BLOOD LEFT FOREARM  Final   Special Requests   Final    BOTTLES DRAWN AEROBIC AND ANAEROBIC Blood Culture adequate volume  Culture   Final    NO GROWTH 5 DAYS Performed at [facility], [address] [place], [place] [address]    Report Status 08/15/2019 FINAL  Final  Culture, blood (Routine X 2) w Reflex to ID Panel     Status: None   Collection Time: 08/13/2019  9:58 PM   Specimen: BLOOD  Result Value Ref Range Status   Specimen Description BLOOD LEFT ASSIST CONTROL  Final   Special Requests   Final    BOTTLES DRAWN AEROBIC AND ANAEROBIC Blood Culture results may not be optimal due to an excessive volume of blood received in culture bottles   Culture   Final    NO GROWTH 5 DAYS Performed at [facility], [address], [place], [place] [address]    Report Status 08/15/2019 FINAL  Final  Urine Culture     Status: Abnormal   Collection Time: 08/11/19  9:33 AM   Specimen: Urine, Clean Catch  Result Value Ref Range Status   Specimen Description    Final    URINE, CLEAN CATCH Performed at [facility], [address] [place], [place] [address]    Special Requests   Final    Normal Performed at [facility], [address] [place], [place] [address]    Culture >=100,000 COLONIES/mL ENTEROCOCCUS FAECALIS (A)  Final   Report Status 08/13/2019 FINAL  Final   Organism ID, Bacteria ENTEROCOCCUS FAECALIS (A)  Final      Susceptibility   Enterococcus faecalis - MIC*    AMPICILLIN <=2 SENSITIVE Sensitive     NITROFURANTOIN <=16 SENSITIVE Sensitive     VANCOMYCIN 1 SENSITIVE Sensitive     * >=100,000 COLONIES/mL ENTEROCOCCUS FAECALIS  MRSA PCR Screening     Status: None   Collection Time: 08/11/19  3:25 PM   Specimen: Nasal Mucosa; Nasopharyngeal  Result Value Ref Range Status   MRSA by PCR NEGATIVE NEGATIVE Final    Comment:        The GeneXpert MRSA Assay (FDA approved for NASAL specimens only), is one component of a comprehensive MRSA colonization surveillance program. It is not intended to diagnose MRSA infection nor to guide or monitor treatment for MRSA infections. Performed at [facility], [address] [place], [place] [address]      Scheduled Meds: . latanoprost  1 drop Both Eyes QHS   Continuous Infusions: . morphine 8 mg/hr (08/16/19 0515)    Assessment/Plan:  1. Acute hypoxic respiratory failure secondary to COVID-19 pneumonia. 2. Acute delirium 3. Recent diagnosis of pericarditis 4. UTI with Enterococcus 5. GERD 6. Glaucoma 7. BPH 8. Weakness  Patient made comfort care measures yesterday.  Started on morphine drip and comfort care medications.  Pulse ox in the 70s.  Patient breathing comfortably on morphine drip 8 mg/h.  Continue full comfort care measures.  Patient is made a DO NOT RESUSCITATE yesterday.  Code Status:     Code Status Orders  (From admission, onward)         Start     Ordered   08/15/19 1428  DNR (Do not attempt resuscitation)  Continuous     Question Answer Comment  In the event of cardiac or respiratory ARREST Do not call a "code blue"   In the event of cardiac or respiratory ARREST Do not perform Intubation, CPR, defibrillation or ACLS   In the event of cardiac or respiratory ARREST Use medication by any route, position, wound care, and other measures  to relive pain and suffering. May use oxygen, suction and manual treatment of airway obstruction as needed for comfort.      08/15/19 1427        Code Status History    Date Active Date Inactive Code Status Order ID Comments User Context   09/03/2019 2054 08/15/2019 1427 Full Code [id number]  Sidney Ace Arvella Merles, MD ED   08/25/2019 1919 08/08/2019 2054 DNR [id number]  Christel Mormon, MD ED   05/22/2019 1413 05/24/2019 1746 DNR [id number]  Dustin Flock, MD Inpatient   05/22/2019 1338 05/22/2019 1413 Full Code [id number]  Dustin Flock, MD Inpatient   05/06/2016 1525 05/08/2016 1226 Full Code [id number]  Loletha Grayer, MD ED   Advance Care Planning Activity     Disposition Plan: Patient made comfort care measures  Time spent: 26 minutes  Amear Strojny [employer]

## 2019-08-16 NOTE — Progress Notes (Signed)
At patient bedside. Patient is asleep.  I held his hand and checked peripheral pulses. Good and strong.  He is relaxed and does not look to be in pain.  I will come later to turn him if he does not turn himself in the next few hours.  He is receiving morphine 8mg /hr.

## 2019-09-06 NOTE — Progress Notes (Signed)
Patient ID: Bill Cooper, male   DOB: 1930/07/22, 86 y.o.   MRN: [id number] [facility] PROGRESS NOTE  Bill Cooper CBU:[id number] DOB: 1930-04-08 DOA: 08/22/2019 PCP: Bill Ferrier, MD  HPI/Subjective: Patient on comfort care measures and morphine drip.  Respirations have decreased.  Pulse ox is very low in the 70s.  Patient having periods of agonal type breathing.  Patient unresponsive to sternal rub.  Objective: Vitals:   Sep 07, 2019 0600 09/07/19 0700  BP:    Pulse: 98 97  Resp: (!) 8 (!) 8  Temp:    SpO2: (!) 71% (!) 72%    Intake/Output Summary (Last 24 hours) at 09/07/19 0854 Last data filed at 08/16/2019 1500 Gross per 24 hour  Intake 56.05 ml  Output --  Net 56.05 ml   Filed Weights   08/23/2019 1415  Weight: 72.6 kg    ROS: Review of Systems  Unable to perform ROS: Critical illness   Exam: Physical Exam  Constitutional: He is sedated.  HENT:  Nose: No mucosal edema.  Unable to look into mouth  Eyes: Conjunctivae and lids are normal.  Neck: Carotid bruit is not present.  Cardiovascular: Regular rhythm, S1 normal, S2 normal and normal heart sounds.  Respiratory: No accessory muscle usage. He has decreased breath sounds in the right lower field and the left lower field. He has no wheezes. He has rhonchi bilaterally he has no rales.  GI: Soft. There is no abdominal tenderness.  Musculoskeletal:     Right ankle: No swelling.     Left ankle: No swelling.  Neurological:  Unresponsive to sternal rub  Skin: Skin is warm.  Psychiatric:  Unresponsive to sternal rub      Data Reviewed: Basic Metabolic Panel: Recent Labs  Lab 08/11/19 0517 08/12/19 0338 08/13/19 0437 08/14/19 0542 08/15/19 0515  NA 136 137 136 137 137  K 4.1 4.4 4.1 4.4 3.9  CL 103 104 103 103 102  CO2 23 23 21* 23 23  GLUCOSE 172* 146* 117* 131* 99  BUN 18 22 22  24* 28*  CREATININE 0.63 0.53* 0.63 0.68 0.82  CALCIUM 8.0* 8.2* 8.1* 8.2* 8.0*   Liver Function  Tests: Recent Labs  Lab 08/11/19 0517 08/12/19 0338 08/13/19 0437 08/14/19 0542 08/15/19 0515  AST 43* 41 78* 42* 33  ALT 31 30 63* 49* 40  ALKPHOS 67 64 67 70 69  BILITOT 0.7 0.6 0.7 0.8 0.9  PROT 5.8* 5.7* 5.9* 5.8* 5.6*  ALBUMIN 2.5* 2.3* 2.5* 2.4* 2.4*   Recent Labs  Lab 08/26/2019 1424  LIPASE 24   CBC: Recent Labs  Lab 08/11/19 0517 08/12/19 0338 08/13/19 0437 08/14/19 0542 08/15/19 0515  WBC 3.2* 7.3 8.9 7.9 11.4*  NEUTROABS 2.4 6.1 7.6 6.6 9.7*  HGB 13.4 13.7 14.2 13.9 13.9  HCT 40.0 41.7 42.8 41.6 42.1  MCV 89.1 89.5 89.0 89.3 89.2  PLT 200 232 264 252 271    CBG: Recent Labs  Lab 08/14/19 2114 08/15/19 0010 08/15/19 0324 08/15/19 0802 08/15/19 1318  GLUCAP 146* 86 109* 126* 87    Recent Results (from the past 240 hour(s))  Culture, blood (Routine X 2) w Reflex to ID Panel     Status: None   Collection Time: 08/25/2019  9:58 PM   Specimen: BLOOD  Result Value Ref Range Status   Specimen Description BLOOD LEFT FOREARM  Final   Special Requests   Final    BOTTLES DRAWN AEROBIC AND ANAEROBIC Blood Culture adequate volume  Culture   Final    NO GROWTH 5 DAYS Performed at [facility], [address] [place], [place] [address]    Report Status 08/15/2019 FINAL  Final  Culture, blood (Routine X 2) w Reflex to ID Panel     Status: None   Collection Time: 08-31-2019  9:58 PM   Specimen: BLOOD  Result Value Ref Range Status   Specimen Description BLOOD LEFT ASSIST CONTROL  Final   Special Requests   Final    BOTTLES DRAWN AEROBIC AND ANAEROBIC Blood Culture results may not be optimal due to an excessive volume of blood received in culture bottles   Culture   Final    NO GROWTH 5 DAYS Performed at [facility], [address] [place], [place] [address]    Report Status 08/15/2019 FINAL  Final  Urine Culture     Status: Abnormal   Collection Time: 08/11/19  9:33 AM   Specimen: Urine, Clean Catch  Result Value Ref Range Status    Specimen Description   Final    URINE, CLEAN CATCH Performed at [facility], [address], [place], [place] [address]    Special Requests   Final    Normal Performed at [facility], [address] [place], [place] [address]    Culture >=100,000 COLONIES/mL ENTEROCOCCUS FAECALIS (A)  Final   Report Status 08/13/2019 FINAL  Final   Organism ID, Bacteria ENTEROCOCCUS FAECALIS (A)  Final      Susceptibility   Enterococcus faecalis - MIC*    AMPICILLIN <=2 SENSITIVE Sensitive     NITROFURANTOIN <=16 SENSITIVE Sensitive     VANCOMYCIN 1 SENSITIVE Sensitive     * >=100,000 COLONIES/mL ENTEROCOCCUS FAECALIS  MRSA PCR Screening     Status: None   Collection Time: 08/11/19  3:25 PM   Specimen: Nasal Mucosa; Nasopharyngeal  Result Value Ref Range Status   MRSA by PCR NEGATIVE NEGATIVE Final    Comment:        The GeneXpert MRSA Assay (FDA approved for NASAL specimens only), is one component of a comprehensive MRSA colonization surveillance program. It is not intended to diagnose MRSA infection nor to guide or monitor treatment for MRSA infections. Performed at [facility], [address] [place], [place] [address]      Scheduled Meds: . latanoprost  1 drop Both Eyes QHS   Continuous Infusions: . morphine 8 mg/hr (08/16/19 1842)    Assessment/Plan:  1. Acute hypoxic respiratory failure secondary to COVID-19 pneumonia. 2. Acute delirium 3. Recent diagnosis of pericarditis 4. UTI with Enterococcus 5. GERD 6. Glaucoma 7. BPH 8. Weakness  Patient made comfort care measures.  Continue on morphine drip and comfort care medications.  Pulse ox in the 70s.  Patient now with decreased respirations and agonal breathing and I do not expect the patient to survive much longer.  Code Status:     Code Status Orders  (From admission, onward)         Start     Ordered   08/15/19 1428  DNR (Do not attempt resuscitation)  Continuous     Question Answer Comment  In the event of cardiac or respiratory ARREST Do not call a "code blue"   In the event of cardiac or respiratory ARREST Do not perform Intubation, CPR, defibrillation or ACLS   In the event of cardiac or respiratory ARREST Use medication by any route, position, wound care, and other measures to relive pain and suffering.  May use oxygen, suction and manual treatment of airway obstruction as needed for comfort.      08/15/19 1427        Code Status History    Date Active Date Inactive Code Status Order ID Comments User Context   09-05-19 2054 08/15/2019 1427 Full Code [id number]  Hannah Beat, MD ED   Sep 05, 2019 1919 09/05/19 2054 DNR [id number]  Hannah Beat, MD ED   05/22/2019 1413 05/24/2019 1746 DNR [id number]  Auburn Bilberry, MD Inpatient   05/22/2019 1338 05/22/2019 1413 Full Code [id number]  Auburn Bilberry, MD Inpatient   05/06/2016 1525 05/08/2016 1226 Full Code [id number]  Alford Highland, MD ED   Advance Care Planning Activity     Disposition Plan: Comfort care measures. Case discussed with daughter Cordelia Pen on the phone [phone].  Spoke with nursing staff to set up a face time with patient.  Nurse will check with the nursing staff on to see on whether the patient's wife wants to visit also.  Time spent: 25 minutes  Rossetta [employer]

## 2019-09-06 NOTE — Progress Notes (Signed)
Pt monitor read asystole at 1640. Time of death reported by this RN. Dr Renae Gloss, CDS and pt family notified.

## 2019-09-06 NOTE — Death Summary Note (Signed)
DEATH SUMMARY   Patient Details  Name: Bill Cooper MRN: [id number] DOB: 05/27/1930  Admission/Discharge Information   Admit Date:  2019-08-28  Date of Death: Date of Death: 09/04/19  Time of Death: Time of Death: 1640  Length of Stay: 7  Referring Physician: Lynnea Ferrier, MD   Reason(s) for Hospitalization  Acute hypoxic respiratory failure and COVID-19 infection  Diagnoses  Preliminary cause of death:  Secondary Diagnoses (including complications and co-morbidities):  Active Problems:   Benign prostatic hyperplasia   Acute hypoxemic respiratory failure due to COVID-19 [facility])   Pneumonia due to COVID-19 virus   Rib pain on left side   Pericarditis   Weakness   Acute respiratory failure with hypoxia (HCC)   Acute delirium   Enterococcus UTI   Brief Hospital Course (including significant findings, care, treatment, and services provided and events leading to death)  Bill Cooper is a 86 y.o. year old male who was admitted to the hospital on August 28, 2019 with acute hypoxic respiratory failure and found to be COVID-19 positive with pneumonia.  Patient was admitted to the critical care unit on 100% high flow nasal cannula and also 100% nonrebreather.  Patient was started on empiric antibiotics.  He received remdesivir 5 days.  He received ivermectin.  Critical care team was consulted and followed during the hospital course.  Patient's respiratory status did not improve during the entire hospitalization and still required high amounts of oxygen.  Patient was made comfort care and was placed on morphine drip.  He was on morphine drip for 2 days and passed away on 2019/09/04 at 1640 p.m.  Patient also had acute delirium after being in the ICU for a few days and not sleeping 1 night.  He was made comfort care after that.  Patient also had Enterococcus UTI and was given antibiotics that would cover that.  The patient also had recent diagnosis of pericarditis and was on  colchicine    Pertinent Labs and Studies  Significant Diagnostic Studies DG Chest 2 View  Result Date: 2019/08/28 CLINICAL DATA:  Syncope. EXAM: CHEST - 2 VIEW COMPARISON:  CT 08/04/2019.  Chest x-ray 05/22/2019. FINDINGS: Mediastinum is normal. Heart size normal. Bilateral pulmonary infiltrates, left side greater than right. No pleural effusion or pneumothorax. Degenerative changes scoliosis thoracic spine. IMPRESSION: Bilateral pulmonary infiltrates, left side greater than right. Findings most consistent with pneumonia. Electronically Signed   By: Maisie Fus  Register   On: 28-Aug-2019 15:29   CT ANGIO CHEST PE W OR WO CONTRAST  Result Date: 08/04/2019 CLINICAL DATA:  86 year old male with history of DVT presents with hypotension and chest pain. EXAM: CT ANGIOGRAPHY CHEST WITH CONTRAST TECHNIQUE: Multidetector CT imaging of the chest was performed using the standard protocol during bolus administration of intravenous contrast. Multiplanar CT image reconstructions and MIPs were obtained to evaluate the vascular anatomy. CONTRAST:  18mL OMNIPAQUE IOHEXOL 350 MG/ML SOLN COMPARISON:  CT of the chest abdomen pelvis dated 05/22/2019. FINDINGS: Cardiovascular: Top-normal cardiac size. No significant pericardial effusion. Minimal apparent thickening of the pericardium with induration of the pericardial fat. Clinical correlation is recommended to evaluate for pericarditis. Minimal atherosclerotic calcification of the thoracic aorta. No aneurysmal dilatation. There is no CT evidence of pulmonary embolism. Mediastinum/Nodes: There is no hilar or mediastinal adenopathy. The esophagus and the thyroid gland are grossly unremarkable. No mediastinal fluid collection. Lungs/Pleura: Small bilateral pleural effusions, new since the prior CT. There is a 3 mm left upper lobe nodule (series 6, image 83). No  focal consolidation, pleural effusion, or pneumothorax. The central airways are patent. Upper Abdomen: Right upper quadrant  cholecystectomy and pneumobilia. The visualized upper abdomen is otherwise unremarkable. Musculoskeletal: Osteopenia with degenerative changes of the spine. No acute osseous pathology. Review of the MIP images confirms the above findings. IMPRESSION: 1. No CT evidence of pulmonary embolism. 2. Small bilateral pleural effusions, new since the prior CT. 3. Minimal apparent thickening of the pericardium with induration of the pericardial fat. Clinical correlation is recommended to evaluate for pericarditis. 4. Aortic Atherosclerosis (ICD10-I70.0). These results will be called to the ordering clinician or representative by the Radiology Department at the imaging location. Electronically Signed   By: Elgie CollardArash  Radparvar M.D.   On: 08/04/2019 10:43   US Carotid Bilateral  Result Date: 08/11/2019 CLINICAL DATA:  Syncopal episode. EXAM: BILATERAL CAROTID DUPLEX ULTRASOUND TECHNIQUE: Wallace CullensGray scale imaging, color Doppler and duplex ultrasound were performed of bilateral carotid and vertebral arteries in the neck. COMPARISON:  None. FINDINGS: Criteria: Quantification of carotid stenosis is based on velocity parameters that correlate the residual internal carotid diameter with NASCET-based stenosis levels, using the diameter of the distal internal carotid lumen as the denominator for stenosis measurement. The following velocity measurements were obtained: RIGHT ICA: 87/28 cm/sec CCA: 73/10 cm/sec SYSTOLIC ICA/CCA RATIO:  1.2 ECA: 74 cm/sec LEFT ICA: 71/22 cm/sec CCA: 72/8 cm/sec SYSTOLIC ICA/CCA RATIO:  1.2 ECA: 71 cm/sec RIGHT CAROTID ARTERY: There is a minimal amount of eccentric echogenic plaque involving the proximal aspect the right common carotid artery (image 2). There is a minimal amount of intimal thickening involving the carotid bulb, extending to involve the origin and proximal aspects of the right internal carotid artery (image 24), not resulting in elevated peak systolic velocities within the interrogated course the  right internal carotid artery to suggest a hemodynamically significant stenosis. RIGHT VERTEBRAL ARTERY:  Antegrade flow LEFT CAROTID ARTERY: There is a minimal amount of intimal thickening within the left carotid bulb, extending to involve the origin and proximal aspects of the left internal carotid artery (image 57), not resulting in elevated peak systolic velocities within the interrogated portions of the left internal carotid artery to suggest a hemodynamically significant stenosis. LEFT VERTEBRAL ARTERY:  Antegrade flow IMPRESSION: Very minimal amount of bilateral atherosclerotic plaque and intimal wall thickening, right greater than left, not resulting in a hemodynamically significant stenosis within either internal carotid artery. Electronically Signed   By: Simonne ComeJohn  Watts M.D.   On: 08/11/2019 12:06   DG Chest Port 1 View  Result Date: 08/12/2019 CLINICAL DATA:  COVID positive, left rib pain since fall 2 months prior EXAM: PORTABLE CHEST 1 VIEW COMPARISON:  02-20-20 chest radiograph. FINDINGS: Right rotated chest radiograph. Stable cardiomediastinal silhouette with normal heart size. No pneumothorax. No pleural effusion. Hazy patchy opacities in the lower lungs bilaterally are unchanged. IMPRESSION: Stable hazy patchy bilateral lower lung opacities compatible with pneumonia. Electronically Signed   By: Delbert PhenixJason A Poff M.D.   On: 08/12/2019 15:50   ECHOCARDIOGRAM COMPLETE  Result Date: 08/11/2019   ECHOCARDIOGRAM REPORT   Patient Name:   Bill Cooper Date of Exam: 08/11/2019 Medical Rec #:  [id number]       Height:       66.0 in Accession #:    [id number]      Weight:       160.0 lb Date of Birth:  06/08/1930       BSA:          1.82 m Patient Age:  86 years        BP:           94/76 mmHg Patient Gender: M               HR:           80 bpm. Exam Location:  [facility] Procedure: 2D Echo, Cardiac Doppler and Color Doppler Indications:     Syncope 780.2  History:         Patient has prior history of  Echocardiogram examinations, most                  recent 05/23/2019. DVT, pt tested positive for covid 08/20/2019.  Sonographer:     Cristela Blue RDCS (AE) Referring Phys:  [id number] Vernetta Honey MANSY Diagnosing Phys: Julien Nordmann MD  Sonographer Comments: No parasternal window, no subcostal window and suboptimal apical window. IMPRESSIONS  1. Challenging images  2. Left ventricular ejection fraction, by visual estimation, is 50 to 55%. The left ventricle has normal function. There is no left ventricular hypertrophy. Unable to exclude regional wall motion abnormality.  3. Left ventricular diastolic parameters are consistent with Grade I diastolic dysfunction (impaired relaxation).  4. Global right ventricle has normal systolic function.The right ventricular size is normal. No increase in right ventricular wall thickness.  5. Left atrial size was normal.  6. Normal pulmonary artery systolic pressure. FINDINGS  Left Ventricle: Left ventricular ejection fraction, by visual estimation, is 50 to 55%. The left ventricle has normal function. The left ventricle demonstrates regional wall motion abnormalities. There is no left ventricular hypertrophy. Left ventricular diastolic parameters are consistent with Grade I diastolic dysfunction (impaired relaxation). Normal left atrial pressure. Right Ventricle: The right ventricular size is normal. No increase in right ventricular wall thickness. Global RV systolic function is has normal systolic function. The tricuspid regurgitant velocity is 2.09 m/s, and with an assumed right atrial pressure  of 10 mmHg, the estimated right ventricular systolic pressure is normal at 27.5 mmHg. Left Atrium: Left atrial size was normal in size. Right Atrium: Right atrial size was normal in size Pericardium: There is no evidence of pericardial effusion. Mitral Valve: The mitral valve is normal in structure. No evidence of mitral valve regurgitation. No evidence of mitral valve stenosis by observation.  Tricuspid Valve: The tricuspid valve is normal in structure. Tricuspid valve regurgitation is mild. Aortic Valve: The aortic valve is normal in structure. Aortic valve regurgitation is not visualized. The aortic valve is structurally normal, with no evidence of sclerosis or stenosis. Aortic valve mean gradient measures 1.0 mmHg. Aortic valve peak gradient measures 2.8 mmHg. Aortic valve area, by VTI measures 3.66 cm. Pulmonic Valve: The pulmonic valve was normal in structure. Pulmonic valve regurgitation is not visualized. Pulmonic regurgitation is not visualized. Aorta: The aortic root, ascending aorta and aortic arch are all structurally normal, with no evidence of dilitation or obstruction. Venous: The inferior vena cava is normal in size with greater than 50% respiratory variability, suggesting right atrial pressure of 3 mmHg. IAS/Shunts: No atrial level shunt detected by color flow Doppler. There is no evidence of a patent foramen ovale. No ventricular septal defect is seen or detected. There is no evidence of an atrial septal defect.  LEFT VENTRICLE PLAX 2D LVIDd:         3.73 cm  Diastology LVIDs:         2.71 cm  LV e' lateral:   5.85 cm/s LV PW:  1.35 cm  LV E/e' lateral: 7.5 LV IVS:        1.15 cm  LV e' medial:    1.31 cm/s LVOT diam:     2.00 cm  LV E/e' medial:  33.4 LV SV:         32 ml LV SV Index:   17.29 LVOT Area:     3.14 cm  RIGHT VENTRICLE RV Basal diam:  2.84 cm LEFT ATRIUM         Index LA diam:    4.00 cm 2.20 cm/m  AORTIC VALVE AV Area (Vmax):    2.65 cm AV Area (Vmean):   2.97 cm AV Area (VTI):     3.66 cm AV Vmax:           83.95 cm/s AV Vmean:          50.600 cm/s AV VTI:            0.124 m AV Peak Grad:      2.8 mmHg AV Mean Grad:      1.0 mmHg LVOT Vmax:         70.90 cm/s LVOT Vmean:        47.800 cm/s LVOT VTI:          0.144 m LVOT/AV VTI ratio: 1.17  AORTA Ao Root diam: 3.30 cm MITRAL VALVE                        TRICUSPID VALVE MV Area (PHT): 6.17 cm             TR  Peak grad:   17.5 mmHg MV PHT:        35.67 msec           TR Vmax:        209.00 cm/s MV Decel Time: 123 msec MV E velocity: 43.70 cm/s 103 cm/s  SHUNTS MV A velocity: 90.70 cm/s 70.3 cm/s Systemic VTI:  0.14 m MV E/A ratio:  0.48       1.5       Systemic Diam: 2.00 cm  Julien Nordmann MD Electronically signed by Julien Nordmann MD Signature Date/Time: 08/11/2019/5:31:16 PM    Final    [place] EKG SITE RITE  Result Date: 08/13/2019 If Site Rite image not attached, placement could not be confirmed due to current cardiac rhythm.   Microbiology Recent Results (from the past 240 hour(s))  Culture, blood (Routine X 2) w Reflex to ID Panel     Status: None   Collection Time: 08/16/2019  9:58 PM   Specimen: BLOOD  Result Value Ref Range Status   Specimen Description BLOOD LEFT FOREARM  Final   Special Requests   Final    BOTTLES DRAWN AEROBIC AND ANAEROBIC Blood Culture adequate volume   Culture   Final    NO GROWTH 5 DAYS Performed at [facility], [address] [place], [place] [address]    Report Status 08/15/2019 FINAL  Final  Culture, blood (Routine X 2) w Reflex to ID Panel     Status: None   Collection Time: 08/28/2019  9:58 PM   Specimen: BLOOD  Result Value Ref Range Status   Specimen Description BLOOD LEFT ASSIST CONTROL  Final   Special Requests   Final    BOTTLES DRAWN AEROBIC AND ANAEROBIC Blood Culture results may not be optimal due to an excessive volume of blood received in culture bottles   Culture   Final  NO GROWTH 5 DAYS Performed at [facility], [address] [place], [place] [address]    Report Status 08/15/2019 FINAL  Final  Urine Culture     Status: Abnormal   Collection Time: 08/11/19  9:33 AM   Specimen: Urine, Clean Catch  Result Value Ref Range Status   Specimen Description   Final    URINE, CLEAN CATCH Performed at [facility], [address] [place], [place] [address]    Special Requests   Final    Normal Performed at  [facility], [address] [place], [place] [address]    Culture >=100,000 COLONIES/mL ENTEROCOCCUS FAECALIS (A)  Final   Report Status 08/13/2019 FINAL  Final   Organism ID, Bacteria ENTEROCOCCUS FAECALIS (A)  Final      Susceptibility   Enterococcus faecalis - MIC*    AMPICILLIN <=2 SENSITIVE Sensitive     NITROFURANTOIN <=16 SENSITIVE Sensitive     VANCOMYCIN 1 SENSITIVE Sensitive     * >=100,000 COLONIES/mL ENTEROCOCCUS FAECALIS  MRSA PCR Screening     Status: None   Collection Time: 08/11/19  3:25 PM   Specimen: Nasal Mucosa; Nasopharyngeal  Result Value Ref Range Status   MRSA by PCR NEGATIVE NEGATIVE Final    Comment:        The GeneXpert MRSA Assay (FDA approved for NASAL specimens only), is one component of a comprehensive MRSA colonization surveillance program. It is not intended to diagnose MRSA infection nor to guide or monitor treatment for MRSA infections. Performed at [facility], [address] [place],  [address]     Lab Basic Metabolic Panel: Recent Labs  Lab 08/11/19 [phone] 08/12/19 0338 08/13/19 0437 08/14/19 0542 08/15/19 0515  NA 136 137 136 137 137  K 4.1 4.4 4.1 4.4 3.9  CL 103 104 103 103 102  CO2 23 23 21* 23 23  GLUCOSE 172* 146* 117* 131* 99  BUN 18 22 22  24* 28*  CREATININE 0.63 0.53* 0.63 0.68 0.82  CALCIUM 8.0* 8.2* 8.1* 8.2* 8.0*   Liver Function Tests: Recent Labs  Lab 08/11/19 0517 08/12/19 0338 08/13/19 0437 08/14/19 0542 08/15/19 0515  AST 43* 41 78* 42* 33  ALT 31 30 63* 49* 40  ALKPHOS 67 64 67 70 69  BILITOT 0.7 0.6 0.7 0.8 0.9  PROT 5.8* 5.7* 5.9* 5.8* 5.6*  ALBUMIN 2.5* 2.3* 2.5* 2.4* 2.4*   CBC: Recent Labs  Lab 08/11/19 0517 08/12/19 0338 08/13/19 0437 08/14/19 0542 08/15/19 0515  WBC 3.2* 7.3 8.9 7.9 11.4*  NEUTROABS 2.4 6.1 7.6 6.6 9.7*  HGB 13.4 13.7 14.2 13.9 13.9  HCT 40.0 41.7 42.8 41.6 42.1  MCV 89.1 89.5 89.0 89.3 89.2  PLT 200 232 264 252 271   Sepsis  Labs: Recent Labs  Lab 08/12/19 0338 08/13/19 0437 08/14/19 0542 08/15/19 0515  PROCALCITON <0.10 <0.10  --   --   WBC 7.3 8.9 7.9 11.4*    The patient's wife was updated by me about the patient passing away after I received notice.  Nursing staff will call the patient's daughter in [place].   [employer] 09/05/2019, 5:09 PM

## 2019-09-06 NOTE — Progress Notes (Signed)
Pt appears comfortable. Called daughter & arranged for e-link video call.

## 2019-09-06 NOTE — Progress Notes (Signed)
Morphine drip continued at 8mg /hour. Patient less alert, condom cath placed after cleaning patient to keep him dry and comfortable. Patient is calm and does not appear to be in any pain.

## 2019-09-06 DEATH — deceased

## 2020-07-03 ENCOUNTER — Ambulatory Visit: Payer: Medicare Other | Admitting: Urology
